# Patient Record
Sex: Female | Born: 1935 | Hispanic: No
Health system: Midwestern US, Community
[De-identification: ages and names within clinical notes are randomized; demographics above are authoritative.]

## PROBLEM LIST (undated history)

## (undated) DIAGNOSIS — E039 Hypothyroidism, unspecified: Secondary | ICD-10-CM

## (undated) DIAGNOSIS — E7800 Pure hypercholesterolemia, unspecified: Secondary | ICD-10-CM

## (undated) DIAGNOSIS — I10 Essential (primary) hypertension: Secondary | ICD-10-CM

## (undated) DIAGNOSIS — M109 Gout, unspecified: Secondary | ICD-10-CM

## (undated) DIAGNOSIS — J189 Pneumonia, unspecified organism: Secondary | ICD-10-CM

## (undated) HISTORY — PX: CATARACT EXTRACTION W/ INTRAOCULAR LENS  IMPLANT, BILATERAL: SHX1307

## (undated) HISTORY — DX: Essential (primary) hypertension: I10

## (undated) HISTORY — PX: FEMUR FRACTURE SURGERY: SHX633

## (undated) HISTORY — DX: Gout, unspecified: M10.9

## (undated) HISTORY — PX: FRACTURE SURGERY: SHX138

## (undated) HISTORY — PX: TUBAL LIGATION: SHX77

## (undated) HISTORY — DX: Pure hypercholesterolemia, unspecified: E78.00

---

## 2001-03-01 ENCOUNTER — Encounter: Payer: Self-pay | Admitting: Infectious Diseases

## 2001-03-01 ENCOUNTER — Ambulatory Visit (HOSPITAL_COMMUNITY): Admission: RE | Admit: 2001-03-01 | Discharge: 2001-03-01 | Payer: Self-pay | Admitting: Infectious Diseases

## 2001-07-19 ENCOUNTER — Encounter: Payer: Self-pay | Admitting: Family Medicine

## 2001-07-19 ENCOUNTER — Ambulatory Visit (HOSPITAL_COMMUNITY): Admission: RE | Admit: 2001-07-19 | Discharge: 2001-07-19 | Payer: Self-pay | Admitting: Family Medicine

## 2004-07-21 ENCOUNTER — Ambulatory Visit (HOSPITAL_COMMUNITY): Admission: RE | Admit: 2004-07-21 | Discharge: 2004-07-21 | Payer: Self-pay | Admitting: Ophthalmology

## 2004-07-26 ENCOUNTER — Emergency Department (HOSPITAL_COMMUNITY): Admission: EM | Admit: 2004-07-26 | Discharge: 2004-07-26 | Payer: Self-pay | Admitting: Emergency Medicine

## 2004-10-21 ENCOUNTER — Ambulatory Visit (HOSPITAL_COMMUNITY): Admission: RE | Admit: 2004-10-21 | Discharge: 2004-10-21 | Payer: Self-pay | Admitting: Family Medicine

## 2006-07-06 ENCOUNTER — Ambulatory Visit (HOSPITAL_COMMUNITY): Admission: RE | Admit: 2006-07-06 | Discharge: 2006-07-06 | Payer: Self-pay

## 2006-08-13 ENCOUNTER — Other Ambulatory Visit: Admission: RE | Admit: 2006-08-13 | Discharge: 2006-08-13 | Payer: Self-pay | Admitting: Internal Medicine

## 2006-08-21 ENCOUNTER — Encounter: Admission: RE | Admit: 2006-08-21 | Discharge: 2006-08-21 | Payer: Self-pay | Admitting: Internal Medicine

## 2007-03-25 ENCOUNTER — Encounter: Admission: RE | Admit: 2007-03-25 | Discharge: 2007-03-25 | Payer: Self-pay | Admitting: Internal Medicine

## 2009-07-05 ENCOUNTER — Other Ambulatory Visit: Admission: RE | Admit: 2009-07-05 | Discharge: 2009-07-05 | Payer: Self-pay | Admitting: Internal Medicine

## 2009-08-30 ENCOUNTER — Encounter: Admission: RE | Admit: 2009-08-30 | Discharge: 2009-08-30 | Payer: Self-pay | Admitting: Internal Medicine

## 2009-12-21 ENCOUNTER — Emergency Department (HOSPITAL_COMMUNITY): Admission: EM | Admit: 2009-12-21 | Discharge: 2009-12-21 | Payer: Self-pay | Admitting: Emergency Medicine

## 2010-11-03 ENCOUNTER — Encounter: Payer: Self-pay | Admitting: Endocrinology

## 2010-11-03 ENCOUNTER — Encounter: Payer: Self-pay | Admitting: Internal Medicine

## 2010-11-05 ENCOUNTER — Emergency Department (HOSPITAL_COMMUNITY)
Admission: EM | Admit: 2010-11-05 | Discharge: 2010-11-05 | Payer: Self-pay | Source: Home / Self Care | Admitting: Emergency Medicine

## 2011-02-28 NOTE — Consult Note (Signed)
[name], [name]                  ACCOUNT NO.:  [id number]   MEDICAL RECORD NO.:  [id number]          PATIENT TYPE:  EMS   LOCATION:                               FACILITY:  [facility]   PHYSICIAN:  Pramod P. Pearlean Brownie, MD    DATE OF BIRTH:  [birth year]   DATE OF CONSULTATION:  07/26/2004  DATE OF DISCHARGE:                                   CONSULTATION   REASON FOR REFERRAL:  Some dizziness and numbness.   HISTORY OF PRESENT ILLNESS:  The patient is a 75 year old lady, who  developed the sudden onset of nausea, dizziness, and some left hand numbness  this afternoon.  The patient states she was in her garage doing some  cleaning and lifting boxes when she noticed a severe pain in her back.  She  took a tablet of indomethacin 50 mg and following that about half an hour  later she noticed significant nausea, retching, and dizziness.  She called  Dr. Otelia Santee office, her primary care physician, and was seen in  the office and was advised to come here for evaluation for a possible TIA.  The patient still complains of some heaviness in her posterior neck and the  head, but she has had this feeling off and on for awhile.  She does have  some history of mild dizziness, particularly when she gets up apparently  suddenly from the bed, which is longstanding.  She complained of some  numbness involving her little and index fingers, which seems to have  improved and gone now over the last several hours.  She denied any numbness  involving the whole left upper extremity, face, or leg.  She denied true  vertigo today, but has had it in the past, particularly when she moves  suddenly, but it has been transient.  She denies any known history of  stroke, TIA, or significant neurological problems.  She has had some back  pain and sciatica in her left leg, which is not currently active.   PAST MEDICAL HISTORY:  1.  Hypertension.  2.  Gout.  3.  Hypothyroidism.  4.  Glaucoma.   HOME MEDICATIONS:   Naprosyn, Synthroid, Atenolol, Motrin, Norvasc.   MEDICATION ALLERGIES:  None known.   SOCIAL HISTORY:  The patient is retired.  She lives alone in [place].  She does not smoke or drink.   REVIEW OF SYSTEMS:  Not significant for recent fever, loss of weight, cough,  chest pain, or diarrhea.  She has some intermittent dizziness and headaches  off and on.   PHYSICAL EXAMINATION:  GENERAL:  A middle-aged lady of Asian-Indian origin,  who looks comfortable.  VITAL SIGNS:  Pulse is 64 per minute, regular respirations per minute, blood  pressure 133/64.  HEENT:  Distal pulses are well-preserved and nontraumatic.  NECK:  Supple without bruit.  ENT:  Exam is unremarkable.  CARDIAC:  No murmurs or gallops.  LUNGS:  Clear to auscultation.  NEUROLOGICAL:  She is awake, alerted, oriented x3, with normal speech and  language function.  There is no aphasia, apraxia, or  dysarthria.  Pupils are  equal and reactive to light and accommodation.  Visual acuity fields are  equal.  Face is symmetrical.  Palate elevates normally.  Tongue is midline.  Motor system exam revealed __________ , symmetric strength, tone, reflexes,  coordination, and sensation in all 4 extremities.  Examination of the left  hand reveals no sensory loss or possible weakness.  She is able to get up  and walk with a slow but steady gait.  She is slightly unsteady with the  eyes closed on a lateral base and while walking tandem.   DATA REVIEWED:  A non-contrast CAT scan of the head done today reveals no  acute abnormality.  MRI scan of the brain done on July 21, 2004, is also  within normal limits.  There is enlargement of the left lacrimal gland noted  incidentally.   LABORATORY DATA:  WBC count and basic metabolic panel are both normal.  Liver enzymes are within normal limits.   IMPRESSION:  A 75 year old lady with symptoms of transient left hand  tingling in the setting of severe nausea following the addition of   Indomethacin, perhaps some adverse gastrointestinal reaction to the  medication.  She has some longstanding dizziness, which may be of critical  __________ etiology.  I do not think are present symptoms are suggestive of  vertebrobasilar ischemia or transient ischemic attack.  I do not believe  further neurovascular evaluation is indicated at the present time.  She may  be discharged home for further followup with the family physician.  If she  has symptoms of recurrent nausea, __________ dysfunction, she may elect to  be followed up by you as an outpatient.  I have advised her not to take  Indomethacin in the future and to use other nonsteroidal anti-inflammatory  drugs.        ___________________________________________  Sunny Schlein. Pearlean Brownie, MD    PPS/MEDQ  D:  07/26/2004  T:  07/26/2004  Job:  [id number]   cc:   Meredith Staggers, M.D.  [address]  [place]  [place] [address]  Fax: [phone]

## 2011-10-28 DIAGNOSIS — I10 Essential (primary) hypertension: Secondary | ICD-10-CM | POA: Diagnosis not present

## 2011-12-09 DIAGNOSIS — E039 Hypothyroidism, unspecified: Secondary | ICD-10-CM | POA: Diagnosis not present

## 2011-12-09 DIAGNOSIS — M779 Enthesopathy, unspecified: Secondary | ICD-10-CM | POA: Diagnosis not present

## 2012-01-20 DIAGNOSIS — M1A00X0 Idiopathic chronic gout, unspecified site, without tophus (tophi): Secondary | ICD-10-CM | POA: Diagnosis not present

## 2012-01-28 DIAGNOSIS — E039 Hypothyroidism, unspecified: Secondary | ICD-10-CM | POA: Diagnosis not present

## 2012-02-06 DIAGNOSIS — H4011X0 Primary open-angle glaucoma, stage unspecified: Secondary | ICD-10-CM | POA: Diagnosis not present

## 2012-02-06 DIAGNOSIS — H26499 Other secondary cataract, unspecified eye: Secondary | ICD-10-CM | POA: Diagnosis not present

## 2012-02-10 DIAGNOSIS — R238 Other skin changes: Secondary | ICD-10-CM | POA: Diagnosis not present

## 2012-02-10 DIAGNOSIS — M109 Gout, unspecified: Secondary | ICD-10-CM | POA: Diagnosis not present

## 2012-02-10 DIAGNOSIS — L259 Unspecified contact dermatitis, unspecified cause: Secondary | ICD-10-CM | POA: Diagnosis not present

## 2012-02-10 DIAGNOSIS — M6530 Trigger finger, unspecified finger: Secondary | ICD-10-CM | POA: Diagnosis not present

## 2012-04-06 DIAGNOSIS — L0100 Impetigo, unspecified: Secondary | ICD-10-CM | POA: Diagnosis not present

## 2012-04-06 DIAGNOSIS — M6530 Trigger finger, unspecified finger: Secondary | ICD-10-CM | POA: Diagnosis not present

## 2012-04-06 DIAGNOSIS — L259 Unspecified contact dermatitis, unspecified cause: Secondary | ICD-10-CM | POA: Diagnosis not present

## 2012-04-12 DIAGNOSIS — M1A00X0 Idiopathic chronic gout, unspecified site, without tophus (tophi): Secondary | ICD-10-CM | POA: Diagnosis not present

## 2012-04-12 DIAGNOSIS — M6530 Trigger finger, unspecified finger: Secondary | ICD-10-CM | POA: Diagnosis not present

## 2012-04-27 DIAGNOSIS — B354 Tinea corporis: Secondary | ICD-10-CM | POA: Diagnosis not present

## 2012-05-10 DIAGNOSIS — B354 Tinea corporis: Secondary | ICD-10-CM | POA: Diagnosis not present

## 2012-05-10 DIAGNOSIS — N390 Urinary tract infection, site not specified: Secondary | ICD-10-CM | POA: Diagnosis not present

## 2012-06-18 DIAGNOSIS — L259 Unspecified contact dermatitis, unspecified cause: Secondary | ICD-10-CM | POA: Diagnosis not present

## 2012-07-13 DIAGNOSIS — R631 Polydipsia: Secondary | ICD-10-CM | POA: Diagnosis not present

## 2012-07-13 DIAGNOSIS — R358 Other polyuria: Secondary | ICD-10-CM | POA: Diagnosis not present

## 2012-07-13 DIAGNOSIS — R3589 Other polyuria: Secondary | ICD-10-CM | POA: Diagnosis not present

## 2012-07-13 DIAGNOSIS — R51 Headache: Secondary | ICD-10-CM | POA: Diagnosis not present

## 2012-07-14 ENCOUNTER — Telehealth (INDEPENDENT_AMBULATORY_CARE_PROVIDER_SITE_OTHER): Payer: Self-pay | Admitting: General Surgery

## 2012-07-14 DIAGNOSIS — R51 Headache: Secondary | ICD-10-CM | POA: Diagnosis not present

## 2012-07-14 DIAGNOSIS — R7982 Elevated C-reactive protein (CRP): Secondary | ICD-10-CM | POA: Diagnosis not present

## 2012-07-14 DIAGNOSIS — R700 Elevated erythrocyte sedimentation rate: Secondary | ICD-10-CM | POA: Diagnosis not present

## 2012-07-14 NOTE — Telephone Encounter (Signed)
ERROR, NO MESSAGE/GY

## 2012-07-15 ENCOUNTER — Encounter (INDEPENDENT_AMBULATORY_CARE_PROVIDER_SITE_OTHER): Payer: Self-pay | Admitting: General Surgery

## 2012-07-15 DIAGNOSIS — M1A00X0 Idiopathic chronic gout, unspecified site, without tophus (tophi): Secondary | ICD-10-CM | POA: Diagnosis not present

## 2012-07-15 DIAGNOSIS — R700 Elevated erythrocyte sedimentation rate: Secondary | ICD-10-CM | POA: Diagnosis not present

## 2012-07-15 DIAGNOSIS — M6530 Trigger finger, unspecified finger: Secondary | ICD-10-CM | POA: Diagnosis not present

## 2012-07-15 DIAGNOSIS — R51 Headache: Secondary | ICD-10-CM | POA: Diagnosis not present

## 2012-07-22 DIAGNOSIS — G44209 Tension-type headache, unspecified, not intractable: Secondary | ICD-10-CM | POA: Diagnosis not present

## 2012-07-29 DIAGNOSIS — R51 Headache: Secondary | ICD-10-CM | POA: Diagnosis not present

## 2012-08-04 DIAGNOSIS — R51 Headache: Secondary | ICD-10-CM | POA: Diagnosis not present

## 2012-08-04 DIAGNOSIS — H4011X0 Primary open-angle glaucoma, stage unspecified: Secondary | ICD-10-CM | POA: Diagnosis not present

## 2012-08-04 DIAGNOSIS — H209 Unspecified iridocyclitis: Secondary | ICD-10-CM | POA: Diagnosis not present

## 2012-08-09 DIAGNOSIS — R700 Elevated erythrocyte sedimentation rate: Secondary | ICD-10-CM | POA: Diagnosis not present

## 2012-08-09 DIAGNOSIS — R51 Headache: Secondary | ICD-10-CM | POA: Diagnosis not present

## 2012-08-09 DIAGNOSIS — M6530 Trigger finger, unspecified finger: Secondary | ICD-10-CM | POA: Diagnosis not present

## 2012-08-09 DIAGNOSIS — M1A00X0 Idiopathic chronic gout, unspecified site, without tophus (tophi): Secondary | ICD-10-CM | POA: Diagnosis not present

## 2012-09-15 DIAGNOSIS — M719 Bursopathy, unspecified: Secondary | ICD-10-CM | POA: Diagnosis not present

## 2012-09-15 DIAGNOSIS — M67919 Unspecified disorder of synovium and tendon, unspecified shoulder: Secondary | ICD-10-CM | POA: Diagnosis not present

## 2012-11-05 DIAGNOSIS — M79609 Pain in unspecified limb: Secondary | ICD-10-CM | POA: Diagnosis not present

## 2012-11-05 DIAGNOSIS — R109 Unspecified abdominal pain: Secondary | ICD-10-CM | POA: Diagnosis not present

## 2012-11-05 DIAGNOSIS — R351 Nocturia: Secondary | ICD-10-CM | POA: Diagnosis not present

## 2012-12-06 DIAGNOSIS — R700 Elevated erythrocyte sedimentation rate: Secondary | ICD-10-CM | POA: Diagnosis not present

## 2012-12-06 DIAGNOSIS — M6530 Trigger finger, unspecified finger: Secondary | ICD-10-CM | POA: Diagnosis not present

## 2012-12-06 DIAGNOSIS — M1A00X0 Idiopathic chronic gout, unspecified site, without tophus (tophi): Secondary | ICD-10-CM | POA: Diagnosis not present

## 2012-12-06 DIAGNOSIS — R51 Headache: Secondary | ICD-10-CM | POA: Diagnosis not present

## 2012-12-31 DIAGNOSIS — Z72820 Sleep deprivation: Secondary | ICD-10-CM | POA: Diagnosis not present

## 2012-12-31 DIAGNOSIS — G44209 Tension-type headache, unspecified, not intractable: Secondary | ICD-10-CM | POA: Diagnosis not present

## 2012-12-31 DIAGNOSIS — M2669 Other specified disorders of temporomandibular joint: Secondary | ICD-10-CM | POA: Diagnosis not present

## 2012-12-31 DIAGNOSIS — I10 Essential (primary) hypertension: Secondary | ICD-10-CM | POA: Diagnosis not present

## 2013-03-09 DIAGNOSIS — M76899 Other specified enthesopathies of unspecified lower limb, excluding foot: Secondary | ICD-10-CM | POA: Diagnosis not present

## 2013-03-09 DIAGNOSIS — R609 Edema, unspecified: Secondary | ICD-10-CM | POA: Diagnosis not present

## 2013-03-09 DIAGNOSIS — M5430 Sciatica, unspecified side: Secondary | ICD-10-CM | POA: Diagnosis not present

## 2013-04-05 DIAGNOSIS — M25519 Pain in unspecified shoulder: Secondary | ICD-10-CM | POA: Diagnosis not present

## 2013-04-05 DIAGNOSIS — R700 Elevated erythrocyte sedimentation rate: Secondary | ICD-10-CM | POA: Diagnosis not present

## 2013-04-05 DIAGNOSIS — M1610 Unilateral primary osteoarthritis, unspecified hip: Secondary | ICD-10-CM | POA: Diagnosis not present

## 2013-04-05 DIAGNOSIS — M1A00X0 Idiopathic chronic gout, unspecified site, without tophus (tophi): Secondary | ICD-10-CM | POA: Diagnosis not present

## 2013-04-05 DIAGNOSIS — M169 Osteoarthritis of hip, unspecified: Secondary | ICD-10-CM | POA: Diagnosis not present

## 2013-04-05 DIAGNOSIS — M25559 Pain in unspecified hip: Secondary | ICD-10-CM | POA: Diagnosis not present

## 2013-04-05 DIAGNOSIS — M6530 Trigger finger, unspecified finger: Secondary | ICD-10-CM | POA: Diagnosis not present

## 2013-04-05 DIAGNOSIS — R51 Headache: Secondary | ICD-10-CM | POA: Diagnosis not present

## 2013-04-05 DIAGNOSIS — M47817 Spondylosis without myelopathy or radiculopathy, lumbosacral region: Secondary | ICD-10-CM | POA: Diagnosis not present

## 2013-04-26 DIAGNOSIS — M5430 Sciatica, unspecified side: Secondary | ICD-10-CM | POA: Diagnosis not present

## 2013-04-26 DIAGNOSIS — Z006 Encounter for examination for normal comparison and control in clinical research program: Secondary | ICD-10-CM | POA: Diagnosis not present

## 2013-04-26 DIAGNOSIS — J029 Acute pharyngitis, unspecified: Secondary | ICD-10-CM | POA: Diagnosis not present

## 2013-04-26 DIAGNOSIS — M76899 Other specified enthesopathies of unspecified lower limb, excluding foot: Secondary | ICD-10-CM | POA: Diagnosis not present

## 2013-04-26 DIAGNOSIS — R609 Edema, unspecified: Secondary | ICD-10-CM | POA: Diagnosis not present

## 2013-04-26 DIAGNOSIS — R079 Chest pain, unspecified: Secondary | ICD-10-CM | POA: Diagnosis not present

## 2013-05-12 DIAGNOSIS — R109 Unspecified abdominal pain: Secondary | ICD-10-CM | POA: Diagnosis not present

## 2013-05-12 DIAGNOSIS — K148 Other diseases of tongue: Secondary | ICD-10-CM | POA: Diagnosis not present

## 2013-05-18 DIAGNOSIS — R609 Edema, unspecified: Secondary | ICD-10-CM | POA: Diagnosis not present

## 2013-07-22 DIAGNOSIS — E039 Hypothyroidism, unspecified: Secondary | ICD-10-CM | POA: Diagnosis not present

## 2013-07-22 DIAGNOSIS — Z23 Encounter for immunization: Secondary | ICD-10-CM | POA: Diagnosis not present

## 2013-07-22 DIAGNOSIS — Z0000 Encounter for general adult medical examination without abnormal findings: Secondary | ICD-10-CM | POA: Diagnosis not present

## 2013-07-22 DIAGNOSIS — M25559 Pain in unspecified hip: Secondary | ICD-10-CM | POA: Diagnosis not present

## 2013-07-22 DIAGNOSIS — M25519 Pain in unspecified shoulder: Secondary | ICD-10-CM | POA: Diagnosis not present

## 2013-07-22 DIAGNOSIS — E785 Hyperlipidemia, unspecified: Secondary | ICD-10-CM | POA: Diagnosis not present

## 2013-07-22 DIAGNOSIS — I10 Essential (primary) hypertension: Secondary | ICD-10-CM | POA: Diagnosis not present

## 2013-08-04 DIAGNOSIS — M25559 Pain in unspecified hip: Secondary | ICD-10-CM | POA: Diagnosis not present

## 2013-08-08 DIAGNOSIS — M25559 Pain in unspecified hip: Secondary | ICD-10-CM | POA: Diagnosis not present

## 2013-08-15 DIAGNOSIS — M25559 Pain in unspecified hip: Secondary | ICD-10-CM | POA: Diagnosis not present

## 2013-08-22 DIAGNOSIS — M25559 Pain in unspecified hip: Secondary | ICD-10-CM | POA: Diagnosis not present

## 2013-08-25 DIAGNOSIS — M25559 Pain in unspecified hip: Secondary | ICD-10-CM | POA: Diagnosis not present

## 2013-09-12 DIAGNOSIS — M109 Gout, unspecified: Secondary | ICD-10-CM | POA: Diagnosis not present

## 2013-09-12 DIAGNOSIS — M169 Osteoarthritis of hip, unspecified: Secondary | ICD-10-CM | POA: Diagnosis not present

## 2013-09-12 DIAGNOSIS — M1610 Unilateral primary osteoarthritis, unspecified hip: Secondary | ICD-10-CM | POA: Diagnosis not present

## 2013-09-12 DIAGNOSIS — L609 Nail disorder, unspecified: Secondary | ICD-10-CM | POA: Diagnosis not present

## 2013-09-12 DIAGNOSIS — E039 Hypothyroidism, unspecified: Secondary | ICD-10-CM | POA: Diagnosis not present

## 2013-09-12 DIAGNOSIS — I10 Essential (primary) hypertension: Secondary | ICD-10-CM | POA: Diagnosis not present

## 2013-09-12 DIAGNOSIS — M25519 Pain in unspecified shoulder: Secondary | ICD-10-CM | POA: Diagnosis not present

## 2013-09-12 DIAGNOSIS — H409 Unspecified glaucoma: Secondary | ICD-10-CM | POA: Diagnosis not present

## 2013-09-12 DIAGNOSIS — M25559 Pain in unspecified hip: Secondary | ICD-10-CM | POA: Diagnosis not present

## 2013-09-12 DIAGNOSIS — R234 Changes in skin texture: Secondary | ICD-10-CM | POA: Diagnosis not present

## 2013-09-22 DIAGNOSIS — M76899 Other specified enthesopathies of unspecified lower limb, excluding foot: Secondary | ICD-10-CM | POA: Diagnosis not present

## 2014-01-30 DIAGNOSIS — M79609 Pain in unspecified limb: Secondary | ICD-10-CM | POA: Diagnosis not present

## 2014-01-30 DIAGNOSIS — I10 Essential (primary) hypertension: Secondary | ICD-10-CM | POA: Diagnosis not present

## 2014-01-30 DIAGNOSIS — M25519 Pain in unspecified shoulder: Secondary | ICD-10-CM | POA: Diagnosis not present

## 2014-01-30 DIAGNOSIS — H9190 Unspecified hearing loss, unspecified ear: Secondary | ICD-10-CM | POA: Diagnosis not present

## 2014-01-31 DIAGNOSIS — I10 Essential (primary) hypertension: Secondary | ICD-10-CM | POA: Diagnosis not present

## 2014-02-01 DIAGNOSIS — M545 Low back pain, unspecified: Secondary | ICD-10-CM | POA: Diagnosis not present

## 2014-02-01 DIAGNOSIS — R269 Unspecified abnormalities of gait and mobility: Secondary | ICD-10-CM | POA: Diagnosis not present

## 2014-02-02 DIAGNOSIS — H903 Sensorineural hearing loss, bilateral: Secondary | ICD-10-CM | POA: Diagnosis not present

## 2014-02-03 DIAGNOSIS — R269 Unspecified abnormalities of gait and mobility: Secondary | ICD-10-CM | POA: Diagnosis not present

## 2014-02-03 DIAGNOSIS — M545 Low back pain, unspecified: Secondary | ICD-10-CM | POA: Diagnosis not present

## 2014-02-07 DIAGNOSIS — R269 Unspecified abnormalities of gait and mobility: Secondary | ICD-10-CM | POA: Diagnosis not present

## 2014-02-07 DIAGNOSIS — M545 Low back pain, unspecified: Secondary | ICD-10-CM | POA: Diagnosis not present

## 2014-02-10 DIAGNOSIS — M545 Low back pain, unspecified: Secondary | ICD-10-CM | POA: Diagnosis not present

## 2014-02-10 DIAGNOSIS — R269 Unspecified abnormalities of gait and mobility: Secondary | ICD-10-CM | POA: Diagnosis not present

## 2014-02-14 DIAGNOSIS — M545 Low back pain, unspecified: Secondary | ICD-10-CM | POA: Diagnosis not present

## 2014-02-14 DIAGNOSIS — R269 Unspecified abnormalities of gait and mobility: Secondary | ICD-10-CM | POA: Diagnosis not present

## 2014-02-16 DIAGNOSIS — S92309A Fracture of unspecified metatarsal bone(s), unspecified foot, initial encounter for closed fracture: Secondary | ICD-10-CM | POA: Diagnosis not present

## 2014-02-16 DIAGNOSIS — S93409A Sprain of unspecified ligament of unspecified ankle, initial encounter: Secondary | ICD-10-CM | POA: Diagnosis not present

## 2014-02-16 DIAGNOSIS — R079 Chest pain, unspecified: Secondary | ICD-10-CM | POA: Diagnosis not present

## 2014-02-16 DIAGNOSIS — S298XXA Other specified injuries of thorax, initial encounter: Secondary | ICD-10-CM | POA: Diagnosis not present

## 2014-02-16 DIAGNOSIS — M79609 Pain in unspecified limb: Secondary | ICD-10-CM | POA: Diagnosis not present

## 2014-02-16 DIAGNOSIS — L608 Other nail disorders: Secondary | ICD-10-CM | POA: Diagnosis not present

## 2014-02-21 ENCOUNTER — Other Ambulatory Visit: Payer: Self-pay | Admitting: Internal Medicine

## 2014-02-21 DIAGNOSIS — R911 Solitary pulmonary nodule: Secondary | ICD-10-CM

## 2014-02-23 ENCOUNTER — Inpatient Hospital Stay: Admission: RE | Admit: 2014-02-23 | Payer: Self-pay | Source: Ambulatory Visit

## 2014-03-01 ENCOUNTER — Other Ambulatory Visit: Payer: Self-pay

## 2014-03-02 ENCOUNTER — Ambulatory Visit
Admission: RE | Admit: 2014-03-02 | Discharge: 2014-03-02 | Disposition: A | Discharge status: Home / Self Care | Payer: Medicare Other | Source: Ambulatory Visit | Attending: Internal Medicine | Admitting: Internal Medicine

## 2014-03-02 DIAGNOSIS — R079 Chest pain, unspecified: Secondary | ICD-10-CM | POA: Diagnosis not present

## 2014-03-02 DIAGNOSIS — S298XXA Other specified injuries of thorax, initial encounter: Secondary | ICD-10-CM | POA: Diagnosis not present

## 2014-03-02 DIAGNOSIS — R911 Solitary pulmonary nodule: Secondary | ICD-10-CM

## 2014-03-02 MED ORDER — IOHEXOL 300 MG/ML  SOLN
75.0000 mL | Freq: Once | INTRAMUSCULAR | Status: AC | PRN
  Administered 2014-03-02: 75 mL via INTRAVENOUS

## 2014-03-07 ENCOUNTER — Ambulatory Visit (INDEPENDENT_AMBULATORY_CARE_PROVIDER_SITE_OTHER): Payer: Medicare Other | Admitting: Internal Medicine

## 2014-03-07 ENCOUNTER — Telehealth: Payer: Self-pay | Admitting: Internal Medicine

## 2014-03-07 ENCOUNTER — Institutional Professional Consult (permissible substitution): Payer: Medicare Other | Admitting: Internal Medicine

## 2014-03-07 ENCOUNTER — Encounter: Payer: Self-pay | Admitting: Internal Medicine

## 2014-03-07 VITALS — BP 122/68 | HR 57 | Ht 62.5 in | Wt 150.4 lb

## 2014-03-07 DIAGNOSIS — R911 Solitary pulmonary nodule: Secondary | ICD-10-CM | POA: Diagnosis not present

## 2014-03-07 DIAGNOSIS — F329 Major depressive disorder, single episode, unspecified: Secondary | ICD-10-CM | POA: Diagnosis not present

## 2014-03-07 NOTE — Patient Instructions (Signed)
LEft lower lobe 1cm nodule seen May 2015  - do PET scan; will call with results to decide next step

## 2014-03-07 NOTE — Telephone Encounter (Signed)
Spoke to pt she is aware that i precerted her ins she has medicare/bcbs no precert required [name]

## 2014-03-07 NOTE — Progress Notes (Signed)
Subjective:    Patient ID: [name], female    DOB: 1936-08-06, 78 y.o.   MRN: [id number]  PCP Horatio Pel, MD  HPI  IOV 03/07/2014   78 year old [place] immigrant female. Lives in [place] since 1960s. Widowed x > 20 years. Husband was thoracic surgeon   Well at baseline. Some 2-3 weeks ago tripped and hurt left chest. Been attending PT and then advised to see MD. PCP did cXR 02/16/14 which apparently showed nodule. REsulted in CT chest 03/02/14 that shows 1.1cm LLL nodule. She denies  Fever, chills, wheeze, cough, hemoptysis, dyspnea, orthopnea, pnd, edema    IMPRESSION: CT chest 03/02/14 6 x 11 mm left lower lobe nodule, corresponding to the radiographic  abnormality, poorly visualized due to respiratory motion.  Consider follow-up CT chest or PET-CT in 3 months, thoracic surgery  consultation, or possibly percutaneous biopsy.  This recommendation follows the consensus statement: Guidelines for  Management of Small Pulmonary Nodules Detected on CT Scans: A  Statement from the [place] as published in Radiology  2005; 237:395-400.  Electronically Signed  By: Julian Hy M.D.  On: 03/02/2014 08:40    Nodule related hx   -  reports that she has never smoked. She has never used smokeless tobacco.  - passive smoker; when married for > 20 years through husband   Past Medical History  Diagnosis Date  . Hypertension   . Gout   . High cholesterol      No family history on file.   History   Social History  . Marital Status: Widowed    Spouse Name: N/A    Number of Children: N/A  . Years of Education: N/A   Occupational History  . retired    Social History Main Topics  . Smoking status: Never Smoker   . Smokeless tobacco: Never Used  . Alcohol Use: No  . Drug Use: No  . Sexual Activity: Not on file   Other Topics Concern  . Not on file   Social History Narrative  . No narrative on file     Allergies  Allergen Reactions  . Asa  [Aspirin]     GI upset and headache  . Sulfa Antibiotics      No outpatient prescriptions prior to visit.   No facility-administered medications prior to visit.      Review of Systems  Constitutional: Positive for fatigue. Negative for fever and unexpected weight change.  HENT: Positive for dental problem. Negative for congestion, ear pain, nosebleeds, postnasal drip, rhinorrhea, sinus pressure, sneezing, sore throat and trouble swallowing.   Eyes: Negative for redness and itching.  Respiratory: Negative for cough, chest tightness, shortness of breath and wheezing.   Cardiovascular: Negative for palpitations and leg swelling.  Gastrointestinal: Negative for nausea and vomiting.  Genitourinary: Negative for dysuria.  Musculoskeletal: Negative for joint swelling.  Skin: Negative for rash.  Neurological: Negative for headaches.  Hematological: Does not bruise/bleed easily.  Psychiatric/Behavioral: Negative for dysphoric mood. The patient is not nervous/anxious.    Current outpatient prescriptions:allopurinol (ZYLOPRIM) 100 MG tablet, Take 2 tablets by mouth daily. , Disp: , Rfl: ;  amLODipine (NORVASC) 5 MG tablet, Take 1 tablet by mouth daily., Disp: , Rfl: ;  labetalol (NORMODYNE) 200 MG tablet, Take 0.5 tablets by mouth 2 (two) times daily., Disp: , Rfl: ;  levobunolol (BETAGAN) 0.5 % ophthalmic solution, Place 1 drop into both eyes daily., Disp: , Rfl:  levothyroxine (SYNTHROID, LEVOTHROID) 88 MCG tablet, Take 1  tablet by mouth daily., Disp: , Rfl:      Objective:   Physical Exam  Vitals reviewed. Constitutional: She is oriented to person, place, and time. She appears well-developed and well-nourished. No distress.  HENT:  Head: Normocephalic and atraumatic.  Right Ear: External ear normal.  Left Ear: External ear normal.  Mouth/Throat: Oropharynx is clear and moist. No oropharyngeal exudate.  Eyes: Conjunctivae and EOM are normal. Pupils are equal, round, and reactive to  light. Right eye exhibits no discharge. Left eye exhibits no discharge. No scleral icterus.  Neck: Normal range of motion. Neck supple. No JVD present. No tracheal deviation present. No thyromegaly present.  Cardiovascular: Normal rate, regular rhythm, normal heart sounds and intact distal pulses.  Exam reveals no gallop and no friction rub.   No murmur heard. Pulmonary/Chest: Effort normal and breath sounds normal. No respiratory distress. She has no wheezes. She has no rales. She exhibits no tenderness.  Myofascial spasm with left infra-axillary, infra-scapulare tenderness and knot  Abdominal: Soft. Bowel sounds are normal. She exhibits no distension and no mass. There is no tenderness. There is no rebound and no guarding.  Musculoskeletal: Normal range of motion. She exhibits no edema and no tenderness.  Lymphadenopathy:    She has no cervical adenopathy.  Neurological: She is alert and oriented to person, place, and time. She has normal reflexes. No cranial nerve deficit. She exhibits normal muscle tone. Coordination normal.  Skin: Skin is warm and dry. No rash noted. She is not diaphoretic. No erythema. No pallor.  Psychiatric: She has a normal mood and affect. Her behavior is normal. Judgment and thought content normal.    Filed Vitals:   03/07/14 1335  Height: 5' 2.5" (1.588 m)  Weight: 150 lb 6.4 oz (68.221 kg)   Filed Vitals:   03/07/14 1335  BP: 122/68  Pulse: 57  Height: 5' 2.5" (1.588 m)  Weight: 150 lb 6.4 oz (68.221 kg)  SpO2: 97%         Assessment & Plan:  LEft lower lobe 1cm nodule seen May 2015  - do PET scan; will call with results to decide next step

## 2014-03-10 DIAGNOSIS — M79609 Pain in unspecified limb: Secondary | ICD-10-CM | POA: Diagnosis not present

## 2014-03-10 DIAGNOSIS — R911 Solitary pulmonary nodule: Secondary | ICD-10-CM | POA: Insufficient documentation

## 2014-03-10 DIAGNOSIS — S93409A Sprain of unspecified ligament of unspecified ankle, initial encounter: Secondary | ICD-10-CM | POA: Diagnosis not present

## 2014-03-10 DIAGNOSIS — S92309A Fracture of unspecified metatarsal bone(s), unspecified foot, initial encounter for closed fracture: Secondary | ICD-10-CM | POA: Diagnosis not present

## 2014-03-10 NOTE — Assessment & Plan Note (Signed)
Non smoker, but some passive smoking when married. Has new incidental finding of 1cm LLL nodule. INdeterminate prob for lung cancer. Will do PET scan and then decide. Based on results, might consider INDIDx blood assay for Negative predictive value and Oncimmune assay for positive predictive value

## 2014-03-15 DIAGNOSIS — H4011X0 Primary open-angle glaucoma, stage unspecified: Secondary | ICD-10-CM | POA: Diagnosis not present

## 2014-03-20 ENCOUNTER — Encounter (HOSPITAL_COMMUNITY)
Admission: RE | Admit: 2014-03-20 | Discharge: 2014-03-20 | Disposition: A | Discharge status: Home / Self Care | Payer: Medicare Other | Source: Ambulatory Visit | Attending: Internal Medicine | Admitting: Internal Medicine

## 2014-03-20 DIAGNOSIS — R911 Solitary pulmonary nodule: Secondary | ICD-10-CM

## 2014-03-20 DIAGNOSIS — C3430 Malignant neoplasm of lower lobe, unspecified bronchus or lung: Secondary | ICD-10-CM | POA: Insufficient documentation

## 2014-03-20 LAB — GLUCOSE, CAPILLARY: GLUCOSE-CAPILLARY: 97 mg/dL (ref 70–99)

## 2014-03-20 MED ORDER — FLUDEOXYGLUCOSE F - 18 (FDG) INJECTION
7.6000 | Freq: Once | INTRAVENOUS | Status: AC | PRN
  Administered 2014-03-20: 7.6 via INTRAVENOUS

## 2014-03-23 ENCOUNTER — Telehealth: Payer: Self-pay | Admitting: Internal Medicine

## 2014-03-23 NOTE — Telephone Encounter (Signed)
Called spoke with pt. She is scheduled to come in and see MR 05/03/14. Nothing further needed

## 2014-03-23 NOTE — Telephone Encounter (Signed)
[name]  Please give [name] appt to discuss findnigs on PET scan but you can tell her that nodule is not c/w cancer but I need to d/w her other non-urgent findings and plan with the nodule. So, make appt to see me or NP; first avail is fine  Thanks  Dr. Brand Males, M.D., [facility].C.P Pulmonary and Critical Care Medicine Staff Physician [facility] Pager: [phone], If no answer or between  15:00h - 7:00h: call [phone]  03/23/2014 4:54 AM    My plan 1. Nodule - consider serial CT and indidx nodule blood test 2. Maxillary sinus opacification -consider ENT referral 3. Co Art calcification - d/w her about cards referral   PET scan 03/20/14  IMPRESSION:  1. Below malignant range FDG uptake is associated with the left  lower lobe pulmonary nodule. The SUV max is equal to 0.68.  2. Atherosclerotic disease.  3. Right maxillary sinus opacification.  Electronically Signed  By: Kerby Moors M.D.  On: 03/20/2014 09:10

## 2014-03-24 DIAGNOSIS — S92309A Fracture of unspecified metatarsal bone(s), unspecified foot, initial encounter for closed fracture: Secondary | ICD-10-CM | POA: Diagnosis not present

## 2014-03-24 DIAGNOSIS — S93409A Sprain of unspecified ligament of unspecified ankle, initial encounter: Secondary | ICD-10-CM | POA: Diagnosis not present

## 2014-03-24 DIAGNOSIS — M79609 Pain in unspecified limb: Secondary | ICD-10-CM | POA: Diagnosis not present

## 2014-04-03 ENCOUNTER — Encounter: Payer: Self-pay | Admitting: Internal Medicine

## 2014-04-03 ENCOUNTER — Ambulatory Visit (INDEPENDENT_AMBULATORY_CARE_PROVIDER_SITE_OTHER): Payer: Medicare Other | Admitting: Internal Medicine

## 2014-04-03 VITALS — BP 120/74 | HR 62 | Ht 62.0 in | Wt 153.2 lb

## 2014-04-03 DIAGNOSIS — I2584 Coronary atherosclerosis due to calcified coronary lesion: Secondary | ICD-10-CM

## 2014-04-03 DIAGNOSIS — R911 Solitary pulmonary nodule: Secondary | ICD-10-CM

## 2014-04-03 DIAGNOSIS — I2510 Atherosclerotic heart disease of native coronary artery without angina pectoris: Secondary | ICD-10-CM

## 2014-04-03 DIAGNOSIS — J320 Chronic maxillary sinusitis: Secondary | ICD-10-CM | POA: Diagnosis not present

## 2014-04-03 NOTE — Assessment & Plan Note (Signed)
#  Coronary ARtery calcfication  - if you have not had cardiac stress test in [place] last few years then you need one; please find out and let me know

## 2014-04-03 NOTE — Assessment & Plan Note (Addendum)
#  LEft lower lobe 1cm nodule seen May 2015 CT chest   - low uptake on PET scan June 2015 lowers the possibility this cancer to low  - repeat CT chest wo contrast mid to end august 2015 [place]   #FOllowup  Mid to ENd August 2015 after CT chest - have it here or iin [place]  (> 50% of this 15 min visit spent in face to face counseling)

## 2014-04-03 NOTE — Progress Notes (Signed)
Subjective:    Patient ID: [name], female    DOB: 05/18/1936, 78 y.o.   MRN: [id number]  HPI   PCP Horatio Pel, MD  HPI  IOV 03/07/2014   78 year old [place] immigrant female. Lives in [place] since 1960s. Widowed x > 20 years. Husband was thoracic surgeon   Well at baseline. Some 2-3 weeks ago tripped and hurt left chest. Been attending PT and then advised to see MD. PCP did cXR 02/16/14 which apparently showed nodule. REsulted in CT chest 03/02/14 that shows 1.1cm LLL nodule. She denies  Fever, chills, wheeze, cough, hemoptysis, dyspnea, orthopnea, pnd, edema    IMPRESSION: CT chest 03/02/14 6 x 11 mm left lower lobe nodule, corresponding to the radiographic  abnormality, poorly visualized due to respiratory motion.  Consider follow-up CT chest or PET-CT in 3 months, thoracic surgery  consultation, or possibly percutaneous biopsy.  This recommendation follows the consensus statement: Guidelines for  Management of Small Pulmonary Nodules Detected on CT Scans: A  Statement from the [place] as published in Radiology  2005; 237:395-400.  Electronically Signed  By: Julian Hy M.D.  On: 03/02/2014 08:40    Nodule related hx   -  reports that she has never smoked. She has never used smokeless tobacco.  - passive smoker; when married for > 20 years through husband     OV 04/03/2014  Chief Complaint  Patient presents with  . Follow-up    Pt states breathing has improved. Pt denies SOB, cough and CP. Pt states the only complaint she has is while recently traveling in car she had BIL pedal edema and had resolved by the next day. With PET scan results.     FU: Lung nodule LLL 1cm - seen after fall in may 2015  Here for fu after PET Scan.  No new issues. Here to discuss PET results which show 3 issues. PET scan 03/20/14  - LLL nodule SUV is 0.68 and below malignant range. Followup surveillance strategy preferred by her but [place] through  end sept 2015  - Right Max Sinus Opacification on PET but asymptomatic  - Coronary Artery Calficaction +  - she thinks she had a stress test < 5 years ago in the [place] area. She will investigate about this   ROS +ve for chronic issues   Review of Systems  Constitutional: Negative for fever and unexpected weight change.  HENT: Negative for congestion, dental problem, ear pain, nosebleeds, postnasal drip, rhinorrhea, sinus pressure, sneezing, sore throat and trouble swallowing.   Eyes: Negative for redness and itching.  Respiratory: Negative for cough, chest tightness, shortness of breath and wheezing.   Cardiovascular: Positive for leg swelling. Negative for palpitations.  Gastrointestinal: Negative for nausea and vomiting.  Genitourinary: Negative for dysuria.  Musculoskeletal: Positive for joint swelling.  Skin: Negative for rash.  Neurological: Negative for headaches.  Hematological: Does not bruise/bleed easily.  Psychiatric/Behavioral: Negative for dysphoric mood. The patient is not nervous/anxious.        Objective:   Physical Exam Filed Vitals:   04/03/14 1530  BP: 120/74  Pulse: 62  Height: 5\' 2"  (1.575 m)  Weight: 153 lb 3.2 oz (69.491 kg)  SpO2: 98%    Discussion only visit.        Assessment & Plan:  #LEft lower lobe 1cm nodule seen May 2015 CT chest   - low uptake on PET scan June 2015 lowers the possibility this cancer to low  -  repeat CT chest wo contrast mid to end august 2015 [place]   #right Maxillary sinus opacification   - please talk to Horatio Pel, MD about this  #Coronary ARtery calcfication  - if you have not had cardiac stress test in [place] last few years then you need one; please find out and let me know  #FOllowup  Mid August 2015 after CT chest

## 2014-04-03 NOTE — Patient Instructions (Addendum)
#  LEft lower lobe 1cm nodule seen May 2015 CT chest   - low uptake on PET scan June 2015 lowers the possibility this cancer to low  - repeat CT chest wo contrast mid to end august 2015 [place]   #right Maxillary sinus opacification   - please talk to Horatio Pel, MD about this  #Coronary ARtery calcfication  - if you have not had cardiac stress test in [place] last few years then you need one; please find out and let me know  #FOllowup  Mid August 2015 after CT chest

## 2014-04-03 NOTE — Assessment & Plan Note (Signed)
#  right Maxillary sinus opacification   - please talk to Horatio Pel, MD about this

## 2014-04-04 ENCOUNTER — Other Ambulatory Visit: Payer: Self-pay | Admitting: Internal Medicine

## 2014-04-04 DIAGNOSIS — R911 Solitary pulmonary nodule: Secondary | ICD-10-CM

## 2014-05-08 DIAGNOSIS — R300 Dysuria: Secondary | ICD-10-CM | POA: Diagnosis not present

## 2014-05-09 DIAGNOSIS — R011 Cardiac murmur, unspecified: Secondary | ICD-10-CM | POA: Diagnosis not present

## 2014-05-09 DIAGNOSIS — R3989 Other symptoms and signs involving the genitourinary system: Secondary | ICD-10-CM | POA: Diagnosis not present

## 2014-05-12 ENCOUNTER — Telehealth: Payer: Self-pay | Admitting: Internal Medicine

## 2014-05-12 NOTE — Telephone Encounter (Signed)
Will forward to MR to make him aware.

## 2014-05-24 ENCOUNTER — Telehealth: Payer: Self-pay | Admitting: Internal Medicine

## 2014-05-24 NOTE — Telephone Encounter (Signed)
Please let he rknow that is fine but she should try to get a CT chest without contrast next 3-6 months. If she is going to [place], tends to be cheap there

## 2014-05-24 NOTE — Telephone Encounter (Signed)
lmtcb x1 

## 2014-05-24 NOTE — Telephone Encounter (Signed)
FYI for MR 

## 2014-05-25 NOTE — Telephone Encounter (Signed)
lmtcb x2 

## 2014-05-26 NOTE — Telephone Encounter (Signed)
lmomtcb x 3  

## 2014-05-29 NOTE — Telephone Encounter (Signed)
Attempted to call pt again x 4.  Will sign off of this message and wait for the pt to call back.

## 2014-06-01 ENCOUNTER — Inpatient Hospital Stay: Admission: RE | Admit: 2014-06-01 | Payer: Medicare Other | Source: Ambulatory Visit

## 2014-06-08 DIAGNOSIS — J019 Acute sinusitis, unspecified: Secondary | ICD-10-CM | POA: Diagnosis not present

## 2014-07-18 ENCOUNTER — Telehealth: Payer: Self-pay | Admitting: Internal Medicine

## 2014-07-18 NOTE — Telephone Encounter (Signed)
I called pt to schedule an appt from a recall-t does not want to sched an appt at this time.  Pt will call if anyting is needed.  Nothing further needed at this time.  [name]

## 2014-07-19 DIAGNOSIS — I10 Essential (primary) hypertension: Secondary | ICD-10-CM | POA: Diagnosis not present

## 2014-07-19 DIAGNOSIS — E039 Hypothyroidism, unspecified: Secondary | ICD-10-CM | POA: Diagnosis not present

## 2014-07-19 DIAGNOSIS — R609 Edema, unspecified: Secondary | ICD-10-CM | POA: Diagnosis not present

## 2014-07-25 DIAGNOSIS — Z0000 Encounter for general adult medical examination without abnormal findings: Secondary | ICD-10-CM | POA: Diagnosis not present

## 2014-07-25 DIAGNOSIS — Z23 Encounter for immunization: Secondary | ICD-10-CM | POA: Diagnosis not present

## 2014-07-28 DIAGNOSIS — F439 Reaction to severe stress, unspecified: Secondary | ICD-10-CM | POA: Diagnosis not present

## 2014-07-28 DIAGNOSIS — R5383 Other fatigue: Secondary | ICD-10-CM | POA: Diagnosis not present

## 2014-07-28 DIAGNOSIS — M2550 Pain in unspecified joint: Secondary | ICD-10-CM | POA: Diagnosis not present

## 2014-07-28 DIAGNOSIS — M7740 Metatarsalgia, unspecified foot: Secondary | ICD-10-CM | POA: Diagnosis not present

## 2014-08-02 DIAGNOSIS — H4011X2 Primary open-angle glaucoma, moderate stage: Secondary | ICD-10-CM | POA: Diagnosis not present

## 2014-08-10 DIAGNOSIS — M150 Primary generalized (osteo)arthritis: Secondary | ICD-10-CM | POA: Diagnosis not present

## 2014-08-10 DIAGNOSIS — M1A09X0 Idiopathic chronic gout, multiple sites, without tophus (tophi): Secondary | ICD-10-CM | POA: Diagnosis not present

## 2014-08-10 DIAGNOSIS — M5136 Other intervertebral disc degeneration, lumbar region: Secondary | ICD-10-CM | POA: Diagnosis not present

## 2014-08-10 DIAGNOSIS — R700 Elevated erythrocyte sedimentation rate: Secondary | ICD-10-CM | POA: Diagnosis not present

## 2014-08-24 DIAGNOSIS — R079 Chest pain, unspecified: Secondary | ICD-10-CM | POA: Diagnosis not present

## 2014-08-24 DIAGNOSIS — I10 Essential (primary) hypertension: Secondary | ICD-10-CM | POA: Diagnosis not present

## 2014-10-23 DIAGNOSIS — L309 Dermatitis, unspecified: Secondary | ICD-10-CM | POA: Diagnosis not present

## 2014-10-23 DIAGNOSIS — L853 Xerosis cutis: Secondary | ICD-10-CM | POA: Diagnosis not present

## 2014-10-23 DIAGNOSIS — I10 Essential (primary) hypertension: Secondary | ICD-10-CM | POA: Diagnosis not present

## 2014-10-30 DIAGNOSIS — F439 Reaction to severe stress, unspecified: Secondary | ICD-10-CM | POA: Diagnosis not present

## 2014-11-08 DIAGNOSIS — Z23 Encounter for immunization: Secondary | ICD-10-CM | POA: Diagnosis not present

## 2015-03-15 DIAGNOSIS — H4011X1 Primary open-angle glaucoma, mild stage: Secondary | ICD-10-CM | POA: Diagnosis not present

## 2015-03-15 DIAGNOSIS — H16223 Keratoconjunctivitis sicca, not specified as Sjogren's, bilateral: Secondary | ICD-10-CM | POA: Diagnosis not present

## 2015-03-27 DIAGNOSIS — M19041 Primary osteoarthritis, right hand: Secondary | ICD-10-CM | POA: Diagnosis not present

## 2015-03-27 DIAGNOSIS — M1811 Unilateral primary osteoarthritis of first carpometacarpal joint, right hand: Secondary | ICD-10-CM | POA: Diagnosis not present

## 2015-09-01 DIAGNOSIS — Z23 Encounter for immunization: Secondary | ICD-10-CM | POA: Diagnosis not present

## 2015-09-03 DIAGNOSIS — E039 Hypothyroidism, unspecified: Secondary | ICD-10-CM | POA: Diagnosis not present

## 2015-09-03 DIAGNOSIS — N390 Urinary tract infection, site not specified: Secondary | ICD-10-CM | POA: Diagnosis not present

## 2015-09-03 DIAGNOSIS — I10 Essential (primary) hypertension: Secondary | ICD-10-CM | POA: Diagnosis not present

## 2015-10-31 DIAGNOSIS — M179 Osteoarthritis of knee, unspecified: Secondary | ICD-10-CM | POA: Diagnosis not present

## 2015-10-31 DIAGNOSIS — M25561 Pain in right knee: Secondary | ICD-10-CM | POA: Diagnosis not present

## 2015-10-31 DIAGNOSIS — M159 Polyosteoarthritis, unspecified: Secondary | ICD-10-CM | POA: Diagnosis not present

## 2015-11-09 DIAGNOSIS — H16223 Keratoconjunctivitis sicca, not specified as Sjogren's, bilateral: Secondary | ICD-10-CM | POA: Diagnosis not present

## 2015-11-09 DIAGNOSIS — H401132 Primary open-angle glaucoma, bilateral, moderate stage: Secondary | ICD-10-CM | POA: Diagnosis not present

## 2015-12-03 DIAGNOSIS — Z0000 Encounter for general adult medical examination without abnormal findings: Secondary | ICD-10-CM | POA: Diagnosis not present

## 2015-12-07 DIAGNOSIS — R7982 Elevated C-reactive protein (CRP): Secondary | ICD-10-CM | POA: Diagnosis not present

## 2015-12-07 DIAGNOSIS — F329 Major depressive disorder, single episode, unspecified: Secondary | ICD-10-CM | POA: Diagnosis not present

## 2015-12-07 DIAGNOSIS — H6122 Impacted cerumen, left ear: Secondary | ICD-10-CM | POA: Diagnosis not present

## 2015-12-07 DIAGNOSIS — R911 Solitary pulmonary nodule: Secondary | ICD-10-CM | POA: Diagnosis not present

## 2015-12-07 DIAGNOSIS — M109 Gout, unspecified: Secondary | ICD-10-CM | POA: Diagnosis not present

## 2015-12-07 DIAGNOSIS — Z1212 Encounter for screening for malignant neoplasm of rectum: Secondary | ICD-10-CM | POA: Diagnosis not present

## 2015-12-13 DIAGNOSIS — R911 Solitary pulmonary nodule: Secondary | ICD-10-CM | POA: Diagnosis not present

## 2015-12-13 DIAGNOSIS — R079 Chest pain, unspecified: Secondary | ICD-10-CM | POA: Diagnosis not present

## 2015-12-13 DIAGNOSIS — M940 Chondrocostal junction syndrome [Tietze]: Secondary | ICD-10-CM | POA: Diagnosis not present

## 2015-12-13 DIAGNOSIS — M353 Polymyalgia rheumatica: Secondary | ICD-10-CM | POA: Diagnosis not present

## 2015-12-24 DIAGNOSIS — L821 Other seborrheic keratosis: Secondary | ICD-10-CM | POA: Diagnosis not present

## 2015-12-24 DIAGNOSIS — B351 Tinea unguium: Secondary | ICD-10-CM | POA: Diagnosis not present

## 2015-12-28 DIAGNOSIS — M79672 Pain in left foot: Secondary | ICD-10-CM | POA: Diagnosis not present

## 2015-12-28 DIAGNOSIS — M25511 Pain in right shoulder: Secondary | ICD-10-CM | POA: Diagnosis not present

## 2016-01-03 DIAGNOSIS — M25511 Pain in right shoulder: Secondary | ICD-10-CM | POA: Diagnosis not present

## 2016-01-03 DIAGNOSIS — M79672 Pain in left foot: Secondary | ICD-10-CM | POA: Diagnosis not present

## 2016-01-14 DIAGNOSIS — M150 Primary generalized (osteo)arthritis: Secondary | ICD-10-CM | POA: Diagnosis not present

## 2016-01-14 DIAGNOSIS — M25561 Pain in right knee: Secondary | ICD-10-CM | POA: Diagnosis not present

## 2016-01-14 DIAGNOSIS — M5136 Other intervertebral disc degeneration, lumbar region: Secondary | ICD-10-CM | POA: Diagnosis not present

## 2016-01-14 DIAGNOSIS — R700 Elevated erythrocyte sedimentation rate: Secondary | ICD-10-CM | POA: Diagnosis not present

## 2016-01-14 DIAGNOSIS — M1A09X0 Idiopathic chronic gout, multiple sites, without tophus (tophi): Secondary | ICD-10-CM | POA: Diagnosis not present

## 2016-02-20 DIAGNOSIS — S025XXB Fracture of tooth (traumatic), initial encounter for open fracture: Secondary | ICD-10-CM | POA: Diagnosis not present

## 2016-02-20 DIAGNOSIS — R51 Headache: Secondary | ICD-10-CM | POA: Diagnosis not present

## 2016-02-21 DIAGNOSIS — H401134 Primary open-angle glaucoma, bilateral, indeterminate stage: Secondary | ICD-10-CM | POA: Diagnosis not present

## 2016-02-21 DIAGNOSIS — H5712 Ocular pain, left eye: Secondary | ICD-10-CM | POA: Diagnosis not present

## 2016-02-22 DIAGNOSIS — M316 Other giant cell arteritis: Secondary | ICD-10-CM | POA: Diagnosis not present

## 2016-02-22 DIAGNOSIS — H5712 Ocular pain, left eye: Secondary | ICD-10-CM | POA: Diagnosis not present

## 2016-02-27 DIAGNOSIS — M315 Giant cell arteritis with polymyalgia rheumatica: Secondary | ICD-10-CM | POA: Diagnosis not present

## 2016-02-27 DIAGNOSIS — H401134 Primary open-angle glaucoma, bilateral, indeterminate stage: Secondary | ICD-10-CM | POA: Diagnosis not present

## 2016-02-27 DIAGNOSIS — H5712 Ocular pain, left eye: Secondary | ICD-10-CM | POA: Diagnosis not present

## 2016-02-28 DIAGNOSIS — M26622 Arthralgia of left temporomandibular joint: Secondary | ICD-10-CM | POA: Diagnosis not present

## 2016-02-28 DIAGNOSIS — H538 Other visual disturbances: Secondary | ICD-10-CM | POA: Diagnosis not present

## 2016-02-28 DIAGNOSIS — H5712 Ocular pain, left eye: Secondary | ICD-10-CM | POA: Diagnosis not present

## 2016-03-06 DIAGNOSIS — T148 Other injury of unspecified body region: Secondary | ICD-10-CM | POA: Diagnosis not present

## 2016-03-06 DIAGNOSIS — M2550 Pain in unspecified joint: Secondary | ICD-10-CM | POA: Diagnosis not present

## 2016-03-11 DIAGNOSIS — H903 Sensorineural hearing loss, bilateral: Secondary | ICD-10-CM | POA: Diagnosis not present

## 2016-03-11 DIAGNOSIS — H9209 Otalgia, unspecified ear: Secondary | ICD-10-CM | POA: Diagnosis not present

## 2016-04-10 ENCOUNTER — Telehealth: Payer: Self-pay | Admitting: Internal Medicine

## 2016-04-10 NOTE — Telephone Encounter (Signed)
Patient somehow got my cell and called me. Informed her of nodule fu need from 2 years ago. Says she followed up at [place] and "Everythign is fine" and she does not want to followup

## 2016-04-25 DIAGNOSIS — L0390 Cellulitis, unspecified: Secondary | ICD-10-CM | POA: Diagnosis not present

## 2016-04-29 DIAGNOSIS — H903 Sensorineural hearing loss, bilateral: Secondary | ICD-10-CM | POA: Diagnosis not present

## 2016-04-29 DIAGNOSIS — H9209 Otalgia, unspecified ear: Secondary | ICD-10-CM | POA: Diagnosis not present

## 2016-05-06 DIAGNOSIS — M216X2 Other acquired deformities of left foot: Secondary | ICD-10-CM | POA: Diagnosis not present

## 2016-05-06 DIAGNOSIS — L603 Nail dystrophy: Secondary | ICD-10-CM | POA: Diagnosis not present

## 2016-05-06 DIAGNOSIS — L602 Onychogryphosis: Secondary | ICD-10-CM | POA: Diagnosis not present

## 2016-05-15 DIAGNOSIS — L603 Nail dystrophy: Secondary | ICD-10-CM | POA: Diagnosis not present

## 2016-07-01 DIAGNOSIS — M150 Primary generalized (osteo)arthritis: Secondary | ICD-10-CM | POA: Diagnosis not present

## 2016-07-01 DIAGNOSIS — M1A09X0 Idiopathic chronic gout, multiple sites, without tophus (tophi): Secondary | ICD-10-CM | POA: Diagnosis not present

## 2016-07-01 DIAGNOSIS — M25561 Pain in right knee: Secondary | ICD-10-CM | POA: Diagnosis not present

## 2016-07-01 DIAGNOSIS — R700 Elevated erythrocyte sedimentation rate: Secondary | ICD-10-CM | POA: Diagnosis not present

## 2016-07-01 DIAGNOSIS — M5136 Other intervertebral disc degeneration, lumbar region: Secondary | ICD-10-CM | POA: Diagnosis not present

## 2016-07-04 DIAGNOSIS — Z23 Encounter for immunization: Secondary | ICD-10-CM | POA: Diagnosis not present

## 2016-07-22 DIAGNOSIS — R3915 Urgency of urination: Secondary | ICD-10-CM | POA: Diagnosis not present

## 2016-07-22 DIAGNOSIS — M549 Dorsalgia, unspecified: Secondary | ICD-10-CM | POA: Diagnosis not present

## 2016-09-12 DIAGNOSIS — T148XXA Other injury of unspecified body region, initial encounter: Secondary | ICD-10-CM | POA: Diagnosis not present

## 2016-09-12 DIAGNOSIS — M2550 Pain in unspecified joint: Secondary | ICD-10-CM | POA: Diagnosis not present

## 2016-09-12 DIAGNOSIS — M545 Low back pain: Secondary | ICD-10-CM | POA: Diagnosis not present

## 2016-10-10 ENCOUNTER — Emergency Department (HOSPITAL_COMMUNITY): Payer: Medicare Other

## 2016-10-10 ENCOUNTER — Encounter (HOSPITAL_COMMUNITY): Payer: Self-pay

## 2016-10-10 ENCOUNTER — Emergency Department (HOSPITAL_COMMUNITY)
Admission: EM | Admit: 2016-10-10 | Discharge: 2016-10-10 | Disposition: A | Discharge status: Home / Self Care | Payer: Medicare Other | Attending: Emergency Medicine | Admitting: Emergency Medicine

## 2016-10-10 DIAGNOSIS — R0602 Shortness of breath: Secondary | ICD-10-CM | POA: Diagnosis not present

## 2016-10-10 DIAGNOSIS — S42291A Other displaced fracture of upper end of right humerus, initial encounter for closed fracture: Secondary | ICD-10-CM | POA: Diagnosis not present

## 2016-10-10 DIAGNOSIS — S20219A Contusion of unspecified front wall of thorax, initial encounter: Secondary | ICD-10-CM | POA: Insufficient documentation

## 2016-10-10 DIAGNOSIS — W010XXA Fall on same level from slipping, tripping and stumbling without subsequent striking against object, initial encounter: Secondary | ICD-10-CM | POA: Diagnosis not present

## 2016-10-10 DIAGNOSIS — Y9301 Activity, walking, marching and hiking: Secondary | ICD-10-CM | POA: Insufficient documentation

## 2016-10-10 DIAGNOSIS — Y929 Unspecified place or not applicable: Secondary | ICD-10-CM | POA: Diagnosis not present

## 2016-10-10 DIAGNOSIS — Y999 Unspecified external cause status: Secondary | ICD-10-CM | POA: Insufficient documentation

## 2016-10-10 DIAGNOSIS — I10 Essential (primary) hypertension: Secondary | ICD-10-CM | POA: Diagnosis not present

## 2016-10-10 DIAGNOSIS — T148XXA Other injury of unspecified body region, initial encounter: Secondary | ICD-10-CM | POA: Diagnosis not present

## 2016-10-10 DIAGNOSIS — M25511 Pain in right shoulder: Secondary | ICD-10-CM | POA: Diagnosis not present

## 2016-10-10 DIAGNOSIS — S20211A Contusion of right front wall of thorax, initial encounter: Secondary | ICD-10-CM | POA: Diagnosis not present

## 2016-10-10 DIAGNOSIS — S4991XA Unspecified injury of right shoulder and upper arm, initial encounter: Secondary | ICD-10-CM | POA: Diagnosis present

## 2016-10-10 DIAGNOSIS — S42201A Unspecified fracture of upper end of right humerus, initial encounter for closed fracture: Secondary | ICD-10-CM | POA: Diagnosis not present

## 2016-10-10 MED ORDER — MORPHINE SULFATE (PF) 4 MG/ML IV SOLN
4.0000 mg | Freq: Once | INTRAVENOUS | Status: AC
  Administered 2016-10-10: 4 mg via INTRAVENOUS
  Filled 2016-10-10: qty 1

## 2016-10-10 MED ORDER — HYDROCODONE-ACETAMINOPHEN 5-325 MG PO TABS: 1.0000 | ORAL_TABLET | Freq: Four times a day (QID) | ORAL | 0 refills | Status: DC | PRN

## 2016-10-10 MED ORDER — METOCLOPRAMIDE HCL 10 MG PO TABS: 10.0000 mg | ORAL_TABLET | Freq: Four times a day (QID) | ORAL | 0 refills | Status: AC | PRN

## 2016-10-10 MED ORDER — METOCLOPRAMIDE HCL 5 MG/ML IJ SOLN
5.0000 mg | Freq: Once | INTRAMUSCULAR | Status: AC
  Administered 2016-10-10: 5 mg via INTRAVENOUS
  Filled 2016-10-10: qty 2

## 2016-10-10 NOTE — ED Notes (Signed)
Sling applied .

## 2016-10-10 NOTE — ED Triage Notes (Signed)
Per [facility] patient from home.  C/O right shoulder pain after tripping on the flower bed in front of her house.  Patient denies LOC, denies hitting head, denies neck or back pain, denies being on blood thinners.   Per [facility] patient stated that "it feels like it is broken".  Patient has 20 g left hand.  Given 150 mcg fentanyl and 8 mg Zofran IV and 500 ml NS en route.

## 2016-10-10 NOTE — Discharge Instructions (Signed)
Take the pain medicine prescribed for bad pain or Tylenol for mild pain as directed. Don't take Tylenol together with the pain medicine prescribed as the combination can be dangerous to your liver. Call Dr. Trevor Mace office on 10/14/2016 to schedule the next available appointment. Tell office staff that you were seen here. Place an ice pack over the painful area 4 times daily for 30 minutes at a time

## 2016-10-10 NOTE — ED Notes (Signed)
Patient transported to X-ray 

## 2016-10-10 NOTE — ED Provider Notes (Signed)
[facility] DEPT Provider Note   CSN: [id number] Arrival date & time: 10/10/16  1506     History   Chief Complaint Chief Complaint  Patient presents with  . Fall    HPI [name] is a 80 y.o. female.Patient tripped and fell outside of her home 2 PM today. She complains of pain at right upper arm and right anterior ribs since the fall. She was feeling well prior to the event. She denies striking her head denies neck pain denies loss of consciousness denies shortness of breath. Pain worse with changing positions improved with remaining still. EMS treated patient with sling of right arm and fentanyl 150 g IV as well as Zofran 8 mg IV and 500 mL saline bolus . nursing reports to me that she was hemodynamically stable in the field.  HPI  Past Medical History:  Diagnosis Date  . Gout   . High cholesterol   . Hypertension     Patient Active Problem List   Diagnosis Date Noted  . Coronary artery calcification 04/03/2014  . Right maxillary sinusitis, chronic 04/03/2014  . Lung nodule, solitary 03/10/2014    Past Surgical History:  Procedure Laterality Date  . FEMUR FRACTURE SURGERY     rod in right thigh  . TUBAL LIGATION      OB History    No data available       Home Medications    Prior to Admission medications   Medication Sig Start Date End Date Taking? Authorizing Provider  allopurinol (ZYLOPRIM) 100 MG tablet Take 2 tablets by mouth daily.     Historical Provider, MD  amLODipine (NORVASC) 5 MG tablet Take 1 tablet by mouth daily.    Historical Provider, MD  labetalol (NORMODYNE) 200 MG tablet Take 0.5 tablets by mouth 2 (two) times daily.    Historical Provider, MD  levobunolol (BETAGAN) 0.5 % ophthalmic solution Place 1 drop into both eyes daily.    Historical Provider, MD  levothyroxine (SYNTHROID, LEVOTHROID) 88 MCG tablet Take 1 tablet by mouth daily.    Historical Provider, MD  naproxen sodium (ANAPROX) 220 MG tablet Take 220 mg by mouth as needed.     Historical Provider, MD    Family History No family history on file.  Social History Social History  Substance Use Topics  . Smoking status: Never Smoker  . Smokeless tobacco: Never Used  . Alcohol use No     Allergies   Asa [aspirin] and Sulfa antibiotics   Review of Systems Review of Systems  Constitutional: Negative.   HENT: Negative.   Respiratory: Negative.   Cardiovascular: Positive for chest pain.       Right rib pain  Gastrointestinal: Negative.   Musculoskeletal: Positive for arthralgias.       Pain at right upper arm and right shoulder  Skin: Negative.   Neurological: Negative.   Psychiatric/Behavioral: Negative.   All other systems reviewed and are negative.    Physical Exam Updated Vital Signs BP 138/66 (BP Location: Left Arm)   Pulse (!) 56   Temp 97.8 F (36.6 C) (Oral)   Resp 17   SpO2 100%   Physical Exam  Constitutional: She is oriented to person, place, and time. She appears well-developed and well-nourished. She appears distressed.  Appears mildly uncomfortable Glasgow Coma Score 15  HENT:  Head: Normocephalic and atraumatic.  Eyes: Conjunctivae are normal. Pupils are equal, round, and reactive to light.  Neck: Neck supple. No tracheal deviation present. No thyromegaly present.  Nontender posteriorly  Cardiovascular: Normal rate and regular rhythm.   No murmur heard. Pulmonary/Chest: Effort normal and breath sounds normal. She exhibits tenderness.  Tender at right upper chest anteriorly, no crepitance or flail or ecchymosis  Abdominal: Soft. Bowel sounds are normal. She exhibits no distension. There is no tenderness.  Musculoskeletal: Normal range of motion. She exhibits no edema or tenderness.  Right upper extremity skin intact swollen and tender Deltoid. Radial pulse 2+ good capillary refill. Entire spine nontender. Pelvis stable nontender. All other extremities without contusion abrasion or tenderness neurovascularly intact    Neurological: She is alert and oriented to person, place, and time. Coordination normal.  Skin: Skin is warm and dry. No rash noted.  Psychiatric: She has a normal mood and affect.  Nursing note and vitals reviewed.    ED Treatments / Results  Labs (all labs ordered are listed, but only abnormal results are displayed) Labs Reviewed - No data to display  EKG  EKG Interpretation None       Radiology No results found.  Procedures Procedures (including critical care time)  Medications Ordered in ED Medications  morphine 4 MG/ML injection 4 mg (not administered)     Initial Impression / Assessment and Plan / ED Course  I have reviewed the triage vital signs and the nursing notes.  Pertinent labs & imaging results that were available during my care of the patient were reviewed by me and considered in my medical decision making (see chart for details). X-rays viewed by me Results for orders placed or performed during the hospital encounter of 03/20/14  Glucose, capillary  Result Value Ref Range   Glucose-Capillary 97 70 - 99 mg/dL   Dg Ribs Unilateral W/chest Right  Result Date: 10/10/2016 CLINICAL DATA:  Fall today, pt states that she was walking and tripped and fell; most pain in right upper arm; SOB due to pain; no known cardiopulmonary problems; HTN; non smoker; pt was not able to obtain better AP right humerus due to pt condition, best obtainable EXAM: RIGHT RIBS AND CHEST - 3+ VIEW COMPARISON:  Chest x-ray 12/13/2015 heart is mildly enlarged. There is FINDINGS: No focal consolidation or pleural effusion. No pulmonary edema. No pneumothorax. Minimal scarring in the left mid lung zone appear stable. Comminuted fracture of the right humeral head. Oblique views demonstrate no acute rib fracture. Note is made of calcified gallstone. IMPRESSION: 1. Comminuted fracture of the right humeral head. 2. Cholelithiasis. Electronically Signed   By: Nolon Nations M.D.   On:  10/10/2016 16:59   Dg Humerus Right  Result Date: 10/10/2016 CLINICAL DATA:  Fall today, pt states that she was walking and tripped and fell; most pain in right upper arm; SOB due to pain; no known cardiopulmonary problems; HTN; non smoker; pt was not able to obtain better AP right humerus due to pt condition, best obtainable EXAM: RIGHT HUMERUS - 2+ VIEW COMPARISON:  None. FINDINGS: There is a comminuted fracture of the right humeral head, associated with impaction. Positioning is nonstandard secondary to pain and patient's limitations. The position of the humeral head overlies the glenoid fossa on the views provided. However a true orthogonal view has not been performed. IMPRESSION: Comminuted fracture of the right humeral head. Electronically Signed   By: Nolon Nations M.D.   On: 10/10/2016 16:55   Clinical Course     Patient became nauseated after treatment with intravenous morphine. At 8:30 PM she is alert and ambulatory and feels ready to go home after  treatment with intravenous morphine, shoulder mobilizer is on patient and she is comfortable. I've consulted Dr. Ninfa Linden via telephone. Plan prescription Norco, Reglan. Call office in 4 days to arrange to be seen in follow-up   Final Clinical Impressions(s) / ED Diagnoses   diagnosis #1 fall  #2 fracture of right proximal humerus  #3 contusion to chest wall  Final diagnoses:  None    New Prescriptions New Prescriptions   No medications on file     Orlie Dakin, MD 10/10/16 2035

## 2016-10-10 NOTE — ED Notes (Signed)
Patient d/c'd in the care of family.  F/U and medications reviewed.  Patient verbalized understanding.

## 2016-10-14 ENCOUNTER — Ambulatory Visit (INDEPENDENT_AMBULATORY_CARE_PROVIDER_SITE_OTHER): Payer: Medicare Other

## 2016-10-14 ENCOUNTER — Encounter (INDEPENDENT_AMBULATORY_CARE_PROVIDER_SITE_OTHER): Payer: Self-pay | Admitting: Orthopaedic Surgery

## 2016-10-14 ENCOUNTER — Ambulatory Visit (INDEPENDENT_AMBULATORY_CARE_PROVIDER_SITE_OTHER): Payer: Medicare Other | Admitting: Orthopaedic Surgery

## 2016-10-14 DIAGNOSIS — S42291A Other displaced fracture of upper end of right humerus, initial encounter for closed fracture: Secondary | ICD-10-CM

## 2016-10-14 DIAGNOSIS — S42201A Unspecified fracture of upper end of right humerus, initial encounter for closed fracture: Secondary | ICD-10-CM | POA: Insufficient documentation

## 2016-10-14 NOTE — Progress Notes (Signed)
   Office Visit Note   Patient: [name]           Date of Birth: May 27, 1936           MRN: [id number] Visit Date: 10/14/2016              Requested by: Deland Pretty, MD [address] [place], [place] [address] PCP: Horatio Pel, MD   Assessment & Plan: Visit Diagnoses:  1. Other closed displaced fracture of proximal end of right humerus, initial encounter     Plan: displaced 4 part proximal humerus fracture.  May benefit from reverse shoulder replacement which I do not perform.  Urgent referral to Dr. Tamera Punt at [facility] made.    Follow-Up Instructions: Return if symptoms worsen or fail to improve.   Orders:  Orders Placed This Encounter  Procedures  . XR Shoulder Right  . Ambulatory referral to Orthopedic Surgery   No orders of the defined types were placed in this encounter.     Procedures: No procedures performed   Clinical Data: No additional findings.   Subjective: No chief complaint on file.   81 yo [place] female presents today for right proximal humerus fracture s/p fall on 10/10/16.  Was evaluated in the ER and placed in sling and given f/u appt with [place].  Pain is 5/10 and endorses tingling.  Pain doesn't radiate.    Review of Systems  All other systems reviewed and are negative.    Objective: Vital Signs: There were no vitals taken for this visit.  Physical Exam  Constitutional: She is oriented to person, place, and time. She appears well-developed and well-nourished.  Pulmonary/Chest: Effort normal.  Neurological: She is alert and oriented to person, place, and time.  Skin: Skin is warm. Capillary refill takes less than 2 seconds.  Psychiatric: She has a normal mood and affect. Her behavior is normal. Judgment and thought content normal.  Nursing note and vitals reviewed.   Ortho Exam No significant bruising.  NVI distally.  ROM not tested. Specialty Comments:  No specialty comments available.  Imaging: Xr  Shoulder Right  Result Date: 10/14/2016 Displaced 4 part proximal humerus fracture    PMFS History: Patient Active Problem List   Diagnosis Date Noted  . Closed fracture of right proximal humerus 10/14/2016  . Coronary artery calcification 04/03/2014  . Right maxillary sinusitis, chronic 04/03/2014  . Lung nodule, solitary 03/10/2014   Past Medical History:  Diagnosis Date  . Gout   . High cholesterol   . Hypertension     History reviewed. No pertinent family history.  Past Surgical History:  Procedure Laterality Date  . FEMUR FRACTURE SURGERY     rod in right thigh  . TUBAL LIGATION     Social History   Occupational History  . retired    Social History Main Topics  . Smoking status: Never Smoker  . Smokeless tobacco: Never Used  . Alcohol use No  . Drug use: No  . Sexual activity: Not on file

## 2016-10-15 ENCOUNTER — Encounter (HOSPITAL_COMMUNITY): Payer: Self-pay | Admitting: *Deleted

## 2016-10-15 ENCOUNTER — Other Ambulatory Visit: Payer: Self-pay | Admitting: Orthopedic Surgery

## 2016-10-15 DIAGNOSIS — M25511 Pain in right shoulder: Secondary | ICD-10-CM | POA: Diagnosis not present

## 2016-10-15 NOTE — Progress Notes (Signed)
Unable to reach pt by phone. Left pre-op instructions according to Pre-op Call Checklist on pt's voicemail. Pt instructed to arrive at 5:30 AM tomorrow.

## 2016-10-15 NOTE — Anesthesia Preprocedure Evaluation (Addendum)
Anesthesia Evaluation  Patient identified by MRN, date of birth, ID band Patient awake    Reviewed: Allergy & Precautions, NPO status , Patient's Chart, lab work & pertinent test results  History of Anesthesia Complications Negative for: history of anesthetic complications  Airway Mallampati: I  TM Distance: >3 FB Neck ROM: Full    Dental  (+) Caps, Implants, Dental Advisory Given   Pulmonary neg pulmonary ROS,    breath sounds clear to auscultation       Cardiovascular hypertension, Pt. on medications and Pt. on home beta blockers (-) angina Rhythm:Regular Rate:Normal     Neuro/Psych negative neurological ROS     GI/Hepatic Neg liver ROS, GERD  Medicated and Controlled,  Endo/Other  Hypothyroidism   Renal/GU negative Renal ROS     Musculoskeletal   Abdominal   Peds  Hematology negative hematology ROS (+)   Anesthesia Other Findings   Reproductive/Obstetrics                           Anesthesia Physical Anesthesia Plan  ASA: III  Anesthesia Plan: General   Post-op Pain Management: GA combined w/ Regional for post-op pain   Induction: Intravenous  Airway Management Planned: Oral ETT  Additional Equipment:   Intra-op Plan:   Post-operative Plan: Extubation in OR  Informed Consent: I have reviewed the patients History and Physical, chart, labs and discussed the procedure including the risks, benefits and alternatives for the proposed anesthesia with the patient or authorized representative who has indicated his/her understanding and acceptance.   Dental advisory given  Plan Discussed with: CRNA and Surgeon  Anesthesia Plan Comments: (Plan routine monitors, GETA with interscalene block for post op analgesia)        Anesthesia Quick Evaluation

## 2016-10-16 ENCOUNTER — Inpatient Hospital Stay (HOSPITAL_COMMUNITY): Payer: Medicare Other

## 2016-10-16 ENCOUNTER — Inpatient Hospital Stay (HOSPITAL_COMMUNITY)
Admission: RE | Admit: 2016-10-16 | Discharge: 2016-10-18 | DRG: 483 | Disposition: A | Discharge status: Home / Self Care | Payer: Medicare Other | Source: Ambulatory Visit | Attending: Orthopedic Surgery | Admitting: Orthopedic Surgery

## 2016-10-16 ENCOUNTER — Encounter (HOSPITAL_COMMUNITY): Payer: Self-pay | Admitting: Anesthesiology

## 2016-10-16 ENCOUNTER — Encounter (HOSPITAL_COMMUNITY)
Admission: RE | Disposition: A | Discharge status: Home / Self Care | Payer: Self-pay | Source: Ambulatory Visit | Attending: Orthopedic Surgery

## 2016-10-16 ENCOUNTER — Ambulatory Visit (HOSPITAL_COMMUNITY): Payer: Medicare Other | Admitting: Anesthesiology

## 2016-10-16 DIAGNOSIS — K219 Gastro-esophageal reflux disease without esophagitis: Secondary | ICD-10-CM | POA: Diagnosis not present

## 2016-10-16 DIAGNOSIS — E7800 Pure hypercholesterolemia, unspecified: Secondary | ICD-10-CM | POA: Diagnosis present

## 2016-10-16 DIAGNOSIS — I10 Essential (primary) hypertension: Secondary | ICD-10-CM | POA: Diagnosis present

## 2016-10-16 DIAGNOSIS — M109 Gout, unspecified: Secondary | ICD-10-CM | POA: Diagnosis present

## 2016-10-16 DIAGNOSIS — Z96611 Presence of right artificial shoulder joint: Secondary | ICD-10-CM | POA: Diagnosis not present

## 2016-10-16 DIAGNOSIS — I959 Hypotension, unspecified: Secondary | ICD-10-CM | POA: Diagnosis not present

## 2016-10-16 DIAGNOSIS — S42201A Unspecified fracture of upper end of right humerus, initial encounter for closed fracture: Principal | ICD-10-CM | POA: Diagnosis present

## 2016-10-16 DIAGNOSIS — W19XXXA Unspecified fall, initial encounter: Secondary | ICD-10-CM | POA: Diagnosis present

## 2016-10-16 DIAGNOSIS — S42291A Other displaced fracture of upper end of right humerus, initial encounter for closed fracture: Secondary | ICD-10-CM | POA: Diagnosis not present

## 2016-10-16 DIAGNOSIS — Z79899 Other long term (current) drug therapy: Secondary | ICD-10-CM | POA: Diagnosis not present

## 2016-10-16 DIAGNOSIS — E039 Hypothyroidism, unspecified: Secondary | ICD-10-CM | POA: Diagnosis present

## 2016-10-16 DIAGNOSIS — G8918 Other acute postprocedural pain: Secondary | ICD-10-CM | POA: Diagnosis not present

## 2016-10-16 DIAGNOSIS — Z471 Aftercare following joint replacement surgery: Secondary | ICD-10-CM | POA: Diagnosis not present

## 2016-10-16 HISTORY — DX: Pneumonia, unspecified organism: J18.9

## 2016-10-16 HISTORY — PX: REVERSE SHOULDER ARTHROPLASTY: SHX5054

## 2016-10-16 HISTORY — DX: Hypothyroidism, unspecified: E03.9

## 2016-10-16 LAB — BASIC METABOLIC PANEL
Anion gap: 8 (ref 5–15)
BUN: 17 mg/dL (ref 6–20)
CHLORIDE: 106 mmol/L (ref 101–111)
CO2: 23 mmol/L (ref 22–32)
Calcium: 8.5 mg/dL — ABNORMAL LOW (ref 8.9–10.3)
Creatinine, Ser: 1.04 mg/dL — ABNORMAL HIGH (ref 0.44–1.00)
GFR calc Af Amer: 58 mL/min — ABNORMAL LOW (ref >60)
GFR calc non Af Amer: 50 mL/min — ABNORMAL LOW (ref >60)
Glucose, Bld: 101 mg/dL — ABNORMAL HIGH (ref 65–99)
Potassium: 3.7 mmol/L (ref 3.5–5.1)
SODIUM: 137 mmol/L (ref 135–145)

## 2016-10-16 LAB — TYPE AND SCREEN
ABO/RH(D): AB POS
ANTIBODY SCREEN: NEGATIVE

## 2016-10-16 LAB — CBC WITH DIFFERENTIAL/PLATELET
Basophils Absolute: 0 10*3/uL (ref 0.0–0.1)
Basophils Relative: 0 %
Eosinophils Absolute: 0.2 10*3/uL (ref 0.0–0.7)
Eosinophils Relative: 2 %
HEMATOCRIT: 31.1 % — AB (ref 36.0–46.0)
HEMOGLOBIN: 10.5 g/dL — AB (ref 12.0–15.0)
LYMPHS ABS: 1.5 10*3/uL (ref 0.7–4.0)
LYMPHS PCT: 20 %
MCH: 31.9 pg (ref 26.0–34.0)
MCHC: 33.8 g/dL (ref 30.0–36.0)
MCV: 94.5 fL (ref 78.0–100.0)
Monocytes Absolute: 0.7 10*3/uL (ref 0.1–1.0)
Monocytes Relative: 9 %
NEUTROS PCT: 69 %
Neutro Abs: 5.4 10*3/uL (ref 1.7–7.7)
Platelets: 282 10*3/uL (ref 150–400)
RBC: 3.29 MIL/uL — AB (ref 3.87–5.11)
RDW: 13.6 % (ref 11.5–15.5)
WBC: 7.7 10*3/uL (ref 4.0–10.5)

## 2016-10-16 LAB — SURGICAL PCR SCREEN
MRSA, PCR: NEGATIVE
STAPHYLOCOCCUS AUREUS: NEGATIVE

## 2016-10-16 LAB — PROTIME-INR
INR: 1.02
Prothrombin Time: 13.4 seconds (ref 11.4–15.2)

## 2016-10-16 LAB — APTT: aPTT: 36 seconds (ref 24–36)

## 2016-10-16 LAB — ABO/RH: ABO/RH(D): AB POS

## 2016-10-16 SURGERY — ARTHROPLASTY, SHOULDER, TOTAL, REVERSE
Anesthesia: Regional | Site: Shoulder | Laterality: Right

## 2016-10-16 MED ORDER — HYDROMORPHONE HCL 1 MG/ML IJ SOLN
INTRAMUSCULAR | Status: AC
  Filled 2016-10-16: qty 0.5

## 2016-10-16 MED ORDER — LABETALOL HCL 100 MG PO TABS
100.0000 mg | ORAL_TABLET | Freq: Two times a day (BID) | ORAL | Status: DC
  Administered 2016-10-16 – 2016-10-18 (×2): 100 mg via ORAL
  Filled 2016-10-16 (×4): qty 1

## 2016-10-16 MED ORDER — PROPOFOL 10 MG/ML IV BOLUS
INTRAVENOUS | Status: DC | PRN
  Administered 2016-10-16: 100 mg via INTRAVENOUS

## 2016-10-16 MED ORDER — SODIUM CHLORIDE 0.9 % IV SOLN
INTRAVENOUS | Status: DC
  Administered 2016-10-16: 15:00:00 via INTRAVENOUS

## 2016-10-16 MED ORDER — HYDROMORPHONE HCL 1 MG/ML IJ SOLN
INTRAMUSCULAR | Status: AC
  Administered 2016-10-16: 0.5 mg via INTRAVENOUS
  Filled 2016-10-16: qty 0.5

## 2016-10-16 MED ORDER — PHENYLEPHRINE HCL 10 MG/ML IJ SOLN
INTRAMUSCULAR | Status: DC | PRN
  Administered 2016-10-16: 50 ug/min via INTRAVENOUS

## 2016-10-16 MED ORDER — ONDANSETRON HCL 4 MG/2ML IJ SOLN: 4.0000 mg | Freq: Four times a day (QID) | INTRAMUSCULAR | Status: DC | PRN

## 2016-10-16 MED ORDER — CEFAZOLIN SODIUM-DEXTROSE 2-4 GM/100ML-% IV SOLN
2.0000 g | INTRAVENOUS | Status: AC
  Administered 2016-10-16: 2 g via INTRAVENOUS
  Filled 2016-10-16: qty 100

## 2016-10-16 MED ORDER — FLEET ENEMA 7-19 GM/118ML RE ENEM: 1.0000 | ENEMA | Freq: Once | RECTAL | Status: DC | PRN

## 2016-10-16 MED ORDER — EPHEDRINE SULFATE 50 MG/ML IJ SOLN
INTRAMUSCULAR | Status: DC | PRN
  Administered 2016-10-16: 10 mg via INTRAVENOUS

## 2016-10-16 MED ORDER — ALLOPURINOL 100 MG PO TABS
200.0000 mg | ORAL_TABLET | Freq: Every day | ORAL | Status: DC
  Administered 2016-10-17 – 2016-10-18 (×2): 200 mg via ORAL
  Filled 2016-10-16 (×2): qty 2

## 2016-10-16 MED ORDER — LEVOTHYROXINE SODIUM 88 MCG PO TABS
88.0000 ug | ORAL_TABLET | Freq: Every day | ORAL | Status: DC
  Administered 2016-10-17 – 2016-10-18 (×2): 88 ug via ORAL
  Filled 2016-10-16 (×2): qty 1

## 2016-10-16 MED ORDER — CEFAZOLIN IN D5W 1 GM/50ML IV SOLN
1.0000 g | Freq: Four times a day (QID) | INTRAVENOUS | Status: AC
  Administered 2016-10-16 – 2016-10-17 (×3): 1 g via INTRAVENOUS
  Filled 2016-10-16 (×3): qty 50

## 2016-10-16 MED ORDER — LEVOBUNOLOL HCL 0.5 % OP SOLN
1.0000 [drp] | Freq: Every day | OPHTHALMIC | Status: DC
  Administered 2016-10-17 – 2016-10-18 (×2): 1 [drp] via OPHTHALMIC
  Filled 2016-10-16: qty 5

## 2016-10-16 MED ORDER — ALUM & MAG HYDROXIDE-SIMETH 200-200-20 MG/5ML PO SUSP: 30.0000 mL | ORAL | Status: DC | PRN

## 2016-10-16 MED ORDER — POLYETHYLENE GLYCOL 3350 17 G PO PACK: 17.0000 g | PACK | Freq: Every day | ORAL | Status: DC | PRN

## 2016-10-16 MED ORDER — BISACODYL 5 MG PO TBEC: 5.0000 mg | DELAYED_RELEASE_TABLET | Freq: Every day | ORAL | Status: DC | PRN

## 2016-10-16 MED ORDER — MENTHOL 3 MG MT LOZG: 1.0000 | LOZENGE | OROMUCOSAL | Status: DC | PRN

## 2016-10-16 MED ORDER — MEPERIDINE HCL 25 MG/ML IJ SOLN: 6.2500 mg | INTRAMUSCULAR | Status: DC | PRN

## 2016-10-16 MED ORDER — METOCLOPRAMIDE HCL 5 MG PO TABS: 5.0000 mg | ORAL_TABLET | Freq: Three times a day (TID) | ORAL | Status: DC | PRN

## 2016-10-16 MED ORDER — METOCLOPRAMIDE HCL 5 MG/ML IJ SOLN: 5.0000 mg | Freq: Three times a day (TID) | INTRAMUSCULAR | Status: DC | PRN

## 2016-10-16 MED ORDER — PROMETHAZINE HCL 25 MG/ML IJ SOLN: 6.2500 mg | INTRAMUSCULAR | Status: DC | PRN

## 2016-10-16 MED ORDER — HYDROMORPHONE HCL 1 MG/ML IJ SOLN
0.2500 mg | INTRAMUSCULAR | Status: DC | PRN
  Administered 2016-10-16 (×2): 0.5 mg via INTRAVENOUS

## 2016-10-16 MED ORDER — LIDOCAINE HCL (CARDIAC) 20 MG/ML IV SOLN
INTRAVENOUS | Status: DC | PRN
  Administered 2016-10-16: 50 mg via INTRAVENOUS

## 2016-10-16 MED ORDER — LACTATED RINGERS IV SOLN
INTRAVENOUS | Status: DC | PRN
  Administered 2016-10-16 (×2): via INTRAVENOUS

## 2016-10-16 MED ORDER — WHITE PETROLATUM GEL
Status: AC
  Administered 2016-10-16: 16:00:00
  Filled 2016-10-16: qty 1

## 2016-10-16 MED ORDER — PHENOL 1.4 % MT LIQD: 1.0000 | OROMUCOSAL | Status: DC | PRN

## 2016-10-16 MED ORDER — HYDROCODONE-ACETAMINOPHEN 5-325 MG PO TABS
ORAL_TABLET | ORAL | Status: AC
  Filled 2016-10-16: qty 2

## 2016-10-16 MED ORDER — ONDANSETRON HCL 4 MG PO TABS: 4.0000 mg | ORAL_TABLET | Freq: Four times a day (QID) | ORAL | Status: DC | PRN

## 2016-10-16 MED ORDER — FENTANYL CITRATE (PF) 100 MCG/2ML IJ SOLN
INTRAMUSCULAR | Status: AC
  Filled 2016-10-16: qty 2

## 2016-10-16 MED ORDER — SUGAMMADEX SODIUM 200 MG/2ML IV SOLN
INTRAVENOUS | Status: DC | PRN
  Administered 2016-10-16: 139 mg via INTRAVENOUS

## 2016-10-16 MED ORDER — ZOLPIDEM TARTRATE 5 MG PO TABS: 5.0000 mg | ORAL_TABLET | Freq: Every evening | ORAL | Status: DC | PRN

## 2016-10-16 MED ORDER — SODIUM CHLORIDE 0.9 % IR SOLN
Status: DC | PRN
  Administered 2016-10-16: 1000 mL
  Administered 2016-10-16: 3000 mL

## 2016-10-16 MED ORDER — HYDROCODONE-ACETAMINOPHEN 5-325 MG PO TABS
1.0000 | ORAL_TABLET | ORAL | Status: DC | PRN
  Administered 2016-10-16 – 2016-10-18 (×9): 2 via ORAL
  Filled 2016-10-16 (×9): qty 2

## 2016-10-16 MED ORDER — ROCURONIUM BROMIDE 100 MG/10ML IV SOLN
INTRAVENOUS | Status: DC | PRN
  Administered 2016-10-16: 50 mg via INTRAVENOUS

## 2016-10-16 MED ORDER — AMLODIPINE BESYLATE 5 MG PO TABS
5.0000 mg | ORAL_TABLET | Freq: Every day | ORAL | Status: DC
  Administered 2016-10-18: 5 mg via ORAL
  Filled 2016-10-16 (×2): qty 1

## 2016-10-16 MED ORDER — FENTANYL CITRATE (PF) 100 MCG/2ML IJ SOLN
INTRAMUSCULAR | Status: DC | PRN
Start: 2016-10-16 — End: 2016-10-16
  Administered 2016-10-16: 50 ug via INTRAVENOUS

## 2016-10-16 MED ORDER — DIPHENHYDRAMINE HCL 12.5 MG/5ML PO ELIX
12.5000 mg | ORAL_SOLUTION | ORAL | Status: DC | PRN
  Filled 2016-10-16: qty 10

## 2016-10-16 MED ORDER — PROPOFOL 10 MG/ML IV BOLUS
INTRAVENOUS | Status: AC
  Filled 2016-10-16: qty 20

## 2016-10-16 MED ORDER — MORPHINE SULFATE (PF) 2 MG/ML IV SOLN: 1.0000 mg | INTRAVENOUS | Status: DC | PRN

## 2016-10-16 MED ORDER — DOCUSATE SODIUM 100 MG PO CAPS
100.0000 mg | ORAL_CAPSULE | Freq: Two times a day (BID) | ORAL | Status: DC
  Administered 2016-10-16 – 2016-10-18 (×5): 100 mg via ORAL
  Filled 2016-10-16 (×5): qty 1

## 2016-10-16 MED ORDER — MIDAZOLAM HCL 2 MG/2ML IJ SOLN: 0.5000 mg | Freq: Once | INTRAMUSCULAR | Status: DC | PRN

## 2016-10-16 MED ORDER — BUPIVACAINE-EPINEPHRINE (PF) 0.5% -1:200000 IJ SOLN
INTRAMUSCULAR | Status: DC | PRN
  Administered 2016-10-16: 30 mL via PERINEURAL

## 2016-10-16 SURGICAL SUPPLY — 82 items
AID PSTN UNV HD RSTRNT DISP (MISCELLANEOUS) ×1
BASEPLATE P2 COATD GLND 6.5X30 (Shoulder) IMPLANT
BIT DRILL 5/64X5 DISP (BIT) ×1 IMPLANT
BLADE SAW SAG 73X25 THK (BLADE) ×1
BLADE SAW SGTL 73X25 THK (BLADE) ×1 IMPLANT
BLADE SURG 15 STRL LF DISP TIS (BLADE) IMPLANT
BLADE SURG 15 STRL SS (BLADE)
BOWL SMART MIX CTS (DISPOSABLE) ×1 IMPLANT
BSPLAT GLND 30 STRL LF SHLDR (Shoulder) ×1 IMPLANT
CEMENT BONE DEPUY (Cement) ×1 IMPLANT
CHLORAPREP W/TINT 26ML (MISCELLANEOUS) ×2 IMPLANT
COVER MAYO STAND STRL (DRAPES) IMPLANT
COVER SURGICAL LIGHT HANDLE (MISCELLANEOUS) ×2 IMPLANT
DRAPE INCISE IOBAN 66X45 STRL (DRAPES) ×2 IMPLANT
DRAPE ORTHO SPLIT 77X108 STRL (DRAPES) ×4
DRAPE SURG ORHT 6 SPLT 77X108 (DRAPES) ×2 IMPLANT
DRSG AQUACEL AG ADV 3.5X10 (GAUZE/BANDAGES/DRESSINGS) ×2 IMPLANT
ELECT BLADE 4.0 EZ CLEAN MEGAD (MISCELLANEOUS) ×2
ELECT REM PT RETURN 9FT ADLT (ELECTROSURGICAL) ×2
ELECTRODE BLDE 4.0 EZ CLN MEGD (MISCELLANEOUS) ×1 IMPLANT
ELECTRODE REM PT RTRN 9FT ADLT (ELECTROSURGICAL) ×1 IMPLANT
EVACUATOR 1/8 PVC DRAIN (DRAIN) IMPLANT
GLOVE BIO SURGEON STRL SZ7 (GLOVE) ×2 IMPLANT
GLOVE BIO SURGEON STRL SZ7.5 (GLOVE) ×2 IMPLANT
GLOVE BIOGEL PI IND STRL 7.0 (GLOVE) ×1 IMPLANT
GLOVE BIOGEL PI IND STRL 8 (GLOVE) ×1 IMPLANT
GLOVE BIOGEL PI INDICATOR 7.0 (GLOVE) ×1
GLOVE BIOGEL PI INDICATOR 8 (GLOVE) ×1
GOWN STRL REUS W/ TWL LRG LVL3 (GOWN DISPOSABLE) ×3 IMPLANT
GOWN STRL REUS W/ TWL XL LVL3 (GOWN DISPOSABLE) ×1 IMPLANT
GOWN STRL REUS W/TWL LRG LVL3 (GOWN DISPOSABLE) ×6
GOWN STRL REUS W/TWL XL LVL3 (GOWN DISPOSABLE) ×4
HANDPIECE INTERPULSE COAX TIP (DISPOSABLE) ×2
HOOD PEEL AWAY FLYTE STAYCOOL (MISCELLANEOUS) ×4 IMPLANT
INSERT EPOLY STND HUMERUS 32MM (Shoulder) ×2 IMPLANT
INSERT EPOLYSTD HUMERUS 32MM (Shoulder) IMPLANT
KIT BASIN OR (CUSTOM PROCEDURE TRAY) ×2 IMPLANT
KIT ROOM TURNOVER OR (KITS) ×2 IMPLANT
MANIFOLD NEPTUNE II (INSTRUMENTS) ×2 IMPLANT
NDL HYPO 25GX1X1/2 BEV (NEEDLE) IMPLANT
NDL MAYO TROCAR (NEEDLE) IMPLANT
NEEDLE HYPO 25GX1X1/2 BEV (NEEDLE) IMPLANT
NEEDLE MAYO TROCAR (NEEDLE) ×2 IMPLANT
NOZZLE PRISM 8.5MM (MISCELLANEOUS) IMPLANT
NS IRRIG 1000ML POUR BTL (IV SOLUTION) ×2 IMPLANT
P2 COATDE GLNOID BSEPLT 6.5X30 (Shoulder) ×2 IMPLANT
PACK SHOULDER (CUSTOM PROCEDURE TRAY) ×2 IMPLANT
PAD ARMBOARD 7.5X6 YLW CONV (MISCELLANEOUS) ×4 IMPLANT
RESTRAINT HEAD UNIVERSAL NS (MISCELLANEOUS) ×2 IMPLANT
RETRIEVER SUT HEWSON (MISCELLANEOUS) ×1 IMPLANT
SCREW BONE LOCKING RSP 5.0X14 (Screw) ×2 IMPLANT
SCREW BONE RSP LOCK 5X14 (Screw) IMPLANT
SCREW BONE RSP LOCK 5X22 (Screw) IMPLANT
SCREW BONE RSP LOCK 5X26 (Screw) IMPLANT
SCREW BONE RSP LOCKING 5.0X26 (Screw) ×4 IMPLANT
SCREW BONE RSP LOCKING 5.0X32 (Screw) ×2 IMPLANT
SCREW RETAIN W/HEAD 4MM OFFSET (Shoulder) ×1 IMPLANT
SET HNDPC FAN SPRY TIP SCT (DISPOSABLE) ×1 IMPLANT
SLING ARM FOAM STRAP LRG (SOFTGOODS) ×1 IMPLANT
SLING ARM FOAM STRAP MED (SOFTGOODS) ×1 IMPLANT
SPONGE LAP 18X18 X RAY DECT (DISPOSABLE) ×1 IMPLANT
SPONGE LAP 4X18 X RAY DECT (DISPOSABLE) IMPLANT
STEM REV PRIMARY 8X108 (Shoulder) ×1 IMPLANT
STRIP CLOSURE SKIN 1/2X4 (GAUZE/BANDAGES/DRESSINGS) ×2 IMPLANT
SUCTION FRAZIER HANDLE 10FR (MISCELLANEOUS) ×1
SUCTION TUBE FRAZIER 10FR DISP (MISCELLANEOUS) ×1 IMPLANT
SUPPORT WRAP ARM LG (MISCELLANEOUS) ×1 IMPLANT
SUT ETHIBOND NAB CT1 #1 30IN (SUTURE) ×4 IMPLANT
SUT FIBERWIRE #2 38 T-5 BLUE (SUTURE) ×12
SUT MNCRL AB 4-0 PS2 18 (SUTURE) ×2 IMPLANT
SUT SILK 2 0 TIES 17X18 (SUTURE) ×2
SUT SILK 2-0 18XBRD TIE BLK (SUTURE) IMPLANT
SUT VIC AB 2-0 CT1 27 (SUTURE) ×4
SUT VIC AB 2-0 CT1 TAPERPNT 27 (SUTURE) ×1 IMPLANT
SUTURE FIBERWR #2 38 T-5 BLUE (SUTURE) ×1 IMPLANT
SYR CONTROL 10ML LL (SYRINGE) IMPLANT
SYRINGE TOOMEY DISP (SYRINGE) IMPLANT
TAPE FIBER 2MM 7IN #2 BLUE (SUTURE) IMPLANT
TOWEL OR 17X24 6PK STRL BLUE (TOWEL DISPOSABLE) ×2 IMPLANT
TOWEL OR 17X26 10 PK STRL BLUE (TOWEL DISPOSABLE) ×2 IMPLANT
WATER STERILE IRR 1000ML POUR (IV SOLUTION) ×2 IMPLANT
YANKAUER SUCT BULB TIP NO VENT (SUCTIONS) ×1 IMPLANT

## 2016-10-16 NOTE — Anesthesia Procedure Notes (Signed)
Procedure Name: Intubation Date/Time: 10/16/2016 11:06 AM Performed by: Neldon Newport Pre-anesthesia Checklist: Timeout performed, Patient being monitored, Emergency Drugs available, Suction available and Patient identified Patient Re-evaluated:Patient Re-evaluated prior to inductionOxygen Delivery Method: Circle system utilized Preoxygenation: Pre-oxygenation with 100% oxygen Intubation Type: IV induction Ventilation: Mask ventilation without difficulty Laryngoscope Size: Mac and 4 Grade View: Grade II Tube type: Oral Tube size: 7.0 mm Number of attempts: 2 Placement Confirmation: breath sounds checked- equal and bilateral,  positive ETCO2 and ETT inserted through vocal cords under direct vision Secured at: 21 cm Tube secured with: Tape Dental Injury: Teeth and Oropharynx as per pre-operative assessment

## 2016-10-16 NOTE — Anesthesia Procedure Notes (Signed)
Anesthesia Regional Block:  Interscalene brachial plexus block  Pre-Anesthetic Checklist: ,, timeout performed, Correct Patient, Correct Site, Correct Laterality, Correct Procedure, Correct Position, site marked, Risks and benefits discussed,  Surgical consent,  Pre-op evaluation,  At surgeon's request and post-op pain management  Laterality: Right and Upper  Prep: chloraprep       Needles:  Injection technique: Single-shot  Needle Type: Stimulator Needle - 40     Needle Length: 4cm 4 cm Needle Gauge: 21 and 21 G    Additional Needles:  Procedures: nerve stimulator Interscalene brachial plexus block  Nerve Stimulator or Paresthesia:  Response: forearm twitch, 0.45 mA, 0.1 ms,   Additional Responses:   Narrative:  Start time: 10/16/2016 7:03 AM End time: 10/16/2016 7:06 AM Injection made incrementally with aspirations every 5 mL.  Performed by: Personally  Anesthesiologist: Glennon Mac, Shellby Schlink  Additional Notes: Pt identified in Holding room.  Monitors applied. Working IV access confirmed. Sterile prep R neck.  #22ga PNS to forearm twitch at 0.48m threshold.  30cc 0.5% Bupivacaine with 1:200k epi injected incrementally after negative test dose.  Patient asymptomatic, VSS, no heme aspirated, tolerated well.  CJenita Seashore MD

## 2016-10-16 NOTE — H&P (Signed)
[name] is an 81 y.o. female.   Chief Complaint: R shoulder injury HPI: s/p fall with R comminuted proximal humerus fracture.    Past Medical History:  Diagnosis Date  . Gout   . High cholesterol   . Hypertension   . Hypothyroidism     Past Surgical History:  Procedure Laterality Date  . FEMUR FRACTURE SURGERY     rod in right thigh  . TUBAL LIGATION      History reviewed. No pertinent family history. Social History:  reports that she has never smoked. She has never used smokeless tobacco. She reports that she does not drink alcohol or use drugs.  Allergies:  Allergies  Allergen Reactions  . Sulfa Antibiotics     UNSPECIFIED REACTION   . Asa [Aspirin] Other (See Comments)    Reaction:  GI upset and headaches     Medications Prior to Admission  Medication Sig Dispense Refill  . allopurinol (ZYLOPRIM) 100 MG tablet Take 200 mg by mouth daily.     Marland Kitchen amLODipine (NORVASC) 5 MG tablet Take 5 mg by mouth daily.     Marland Kitchen HYDROcodone-acetaminophen (NORCO) 5-325 MG tablet Take 1 tablet by mouth every 6 (six) hours as needed for severe pain. 25 tablet 0  . labetalol (NORMODYNE) 200 MG tablet Take 100 mg by mouth 2 (two) times daily.     Marland Kitchen levobunolol (BETAGAN) 0.5 % ophthalmic solution Place 1 drop into both eyes daily.    Marland Kitchen levothyroxine (SYNTHROID, LEVOTHROID) 88 MCG tablet Take 88 mcg by mouth daily before breakfast.     . metoCLOPramide (REGLAN) 10 MG tablet Take 1 tablet (10 mg total) by mouth every 6 (six) hours as needed for nausea (nausea/headache). 1 tablet every 6 hours as needed for nausea 25 tablet 0    Results for orders placed or performed during the hospital encounter of 10/16/16 (from the past 48 hour(s))  CBC WITH DIFFERENTIAL     Status: Abnormal   Collection Time: 10/16/16  6:47 AM  Result Value Ref Range   WBC 7.7 4.0 - 10.5 K/uL   RBC 3.29 (L) 3.87 - 5.11 MIL/uL   Hemoglobin 10.5 (L) 12.0 - 15.0 g/dL   HCT 31.1 (L) 36.0 - 46.0 %   MCV 94.5 78.0 - 100.0 fL    MCH 31.9 26.0 - 34.0 pg   MCHC 33.8 30.0 - 36.0 g/dL   RDW 13.6 11.5 - 15.5 %   Platelets 282 150 - 400 K/uL   Neutrophils Relative % 69 %   Neutro Abs 5.4 1.7 - 7.7 K/uL   Lymphocytes Relative 20 %   Lymphs Abs 1.5 0.7 - 4.0 K/uL   Monocytes Relative 9 %   Monocytes Absolute 0.7 0.1 - 1.0 K/uL   Eosinophils Relative 2 %   Eosinophils Absolute 0.2 0.0 - 0.7 K/uL   Basophils Relative 0 %   Basophils Absolute 0.0 0.0 - 0.1 K/uL   Xr Shoulder Right  Result Date: 10/14/2016 Displaced 4 part proximal humerus fracture   Review of Systems  All other systems reviewed and are negative.   Blood pressure 130/76, pulse 93, temperature 98.9 F (37.2 C), temperature source Oral, resp. rate 18, SpO2 100 %. Physical Exam  Constitutional: She is oriented to person, place, and time. She appears well-developed and well-nourished.  HENT:  Head: Atraumatic.  Cardiovascular: Intact distal pulses.   Respiratory: Effort normal.  Musculoskeletal:  R shoulder swollen, TTP, NVID.  Neurological: She is alert and oriented to  person, place, and time.  Skin: Skin is warm and dry.  Psychiatric: She has a normal mood and affect.     Assessment/Plan s/p fall with R comminuted proximal humerus fracture.   Indicated for reverse TSA for pain relief and improved function. Risks / benefits of surgery discussed Consent on chart  NPO for OR Preop antibiotics   Nita Sells, MD 10/16/2016, 7:17 AM

## 2016-10-16 NOTE — Op Note (Signed)
Procedure(s): REVERSE SHOULDER ARTHROPLASTY Procedure Note  [name] female 81 y.o. 10/16/2016  Procedure(s) and Anesthesia Type:    * Right REVERSE SHOULDER ARTHROPLASTY - Choice   Indications:  81 y.o. female  With comminuted R proximal humerus fracture dislocation, indicated for surgery to promote long term pain relief and maximize function.     Surgeon: Nita Sells   Assistants: Jeanmarie Hubert PA-C [facility] was present and scrubbed throughout the procedure and was essential in positioning, retraction, exposure, and closure)  Anesthesia: General endotracheal anesthesia with preoperative interscalene block given by the attending anesthesiologist    Procedure Detail  REVERSE SHOULDER ARTHROPLASTY   Estimated Blood Loss:  200 mL         Drains: none  Blood Given: none          Specimens: none        Complications:  * No complications entered in OR log *         Disposition: PACU - hemodynamically stable.         Condition: stable      OPERATIVE FINDINGS:  A DJO Altivate pressfit hybrid cement reverse total shoulder arthroplasty was placed with a  size 8 stem, a 32-4 glenosphere, and a standard-mm poly insert. The tuberosities were reconstructed around the implant  PROCEDURE: The patient was identified in the preoperative holding area  where I personally marked the operative site after verifying site, side,  and procedure with the patient. An interscalene block given by  the attending anesthesiologist in the holding area and the patient was taken back to the operating room where all extremities were  carefully padded in position after general anesthesia was induced. She  was placed in a beach-chair position and the operative upper extremity was  prepped and draped in a standard sterile fashion. An approximately 10-  cm incision was made from the tip of the coracoid process to the center  point of the humerus at the level of the axilla.  Dissection was carried  down through subcutaneous tissues to the level of the cephalic vein  which was taken laterally with the deltoid. The pectoralis major was  retracted medially. The subdeltoid space was developed and the lateral  edge of the conjoined tendon was identified. The undersurface of  conjoined tendon was palpated and the musculocutaneous nerve was not in  the field. Retractor was placed underneath the conjoined and second  retractor was placed lateral into the deltoid. The fracture fragments were identified and fracture team hematoma was removed. The humeral head fragment was mobilized and excised. The lesser tuberosity fragment and subscapularis were tagged with a suture. The posterior tuberosity and rotator cuff were tagged with a second suture. FiberWire sutures were passed around the bone tendon junction of the greater tuberosity high and low for later repair. Attention was then turned to the glenoid.  The anterior and posterior labrum were  completely excised and the capsule was released circumferentially to  allow for exposure of the glenoid for preparation. The 2.5 mm drill was  placed using the guide in 5-10 inferior angulation and the tap was then advanced in the same hole. Small and large reamers were then used. The tap was then removed and the Metaglene was then screwed in with excellent purchase.  The peripheral guide was then used to drilled measured and filled peripheral locking screws. The size  32-4  glenosphere was then impacted on the [facility] taper and the central screw was placed.   The humerus  was then again exposed and the diaphyseal reamers were used up to sounded the canal. A 8 size implant was selected. It was felt that press-fit would not be reliable and cement would be necessary. The joint was reduced and with this implant it was felt that soft tissue tensioning was appropriate. Therefore, final humeral stem was placed hybrid cement distal and press-fit bone  graft proximal.  And then the trial polyethylene inserts were tested again and the above implant was felt to be the most appropriate for final insertion. The joint was reduced and the tuberosities were repaired around the implant using a total of 6 #2 fiber wires through the holes in the implant and around the bone tendon junction. 2 additional drill holes were made in the anterior proximal shaft with sutures passed to allow vertical tension band as well. The joint was then copiously irrigated with pulse  lavage prior to repair the tuberosities and the wound was then closed. Skin was closed with 2-0 Vicryl in a deep dermal layer and 4-0  Monocryl for skin closure. Steri-Strips were applied. Sterile  dressings were then applied as well as a sling. The patient was allowed  to awaken from general anesthesia, transferred to stretcher, and taken  to recovery room in stable condition.   POSTOPERATIVE PLAN: The patient will be kept in the hospital postoperatively  for pain control, antibiotics and therapy. She will begin hand wrist and elbow motion of right away.

## 2016-10-16 NOTE — Discharge Instructions (Signed)

## 2016-10-16 NOTE — Transfer of Care (Signed)
Immediate Anesthesia Transfer of Care Note  Patient: [name]  Procedure(s) Performed: Procedure(s): REVERSE SHOULDER ARTHROPLASTY (Right)  Patient Location: PACU  Anesthesia Type:General  Level of Consciousness: awake, alert  and oriented  Airway & Oxygen Therapy: Patient Spontanous Breathing and Patient connected to nasal cannula oxygen  Post-op Assessment: Report given to RN, Post -op Vital signs reviewed and stable and Patient moving all extremities X 4  Post vital signs: Reviewed and stable  Last Vitals:  Vitals:   10/16/16 0640 10/16/16 1333  BP: 130/76 (P) 95/60  Pulse: 93 65  Resp: 18 13  Temp: 37.2 C 36.4 C    Last Pain:  Vitals:   10/16/16 0640  TempSrc: Oral  PainSc:          Complications: No apparent anesthesia complications

## 2016-10-17 ENCOUNTER — Encounter (HOSPITAL_COMMUNITY): Payer: Self-pay | Admitting: Orthopedic Surgery

## 2016-10-17 LAB — CBC
HEMATOCRIT: 26.3 % — AB (ref 36.0–46.0)
HEMOGLOBIN: 9 g/dL — AB (ref 12.0–15.0)
MCH: 32.3 pg (ref 26.0–34.0)
MCHC: 34.2 g/dL (ref 30.0–36.0)
MCV: 94.3 fL (ref 78.0–100.0)
Platelets: 240 10*3/uL (ref 150–400)
RBC: 2.79 MIL/uL — ABNORMAL LOW (ref 3.87–5.11)
RDW: 13.6 % (ref 11.5–15.5)
WBC: 9.9 10*3/uL (ref 4.0–10.5)

## 2016-10-17 LAB — BASIC METABOLIC PANEL
Anion gap: 8 (ref 5–15)
BUN: 12 mg/dL (ref 6–20)
CHLORIDE: 103 mmol/L (ref 101–111)
CO2: 20 mmol/L — AB (ref 22–32)
Calcium: 7.4 mg/dL — ABNORMAL LOW (ref 8.9–10.3)
Creatinine, Ser: 0.91 mg/dL (ref 0.44–1.00)
GFR calc non Af Amer: 59 mL/min — ABNORMAL LOW (ref >60)
GLUCOSE: 148 mg/dL — AB (ref 65–99)
Potassium: 3.7 mmol/L (ref 3.5–5.1)
Sodium: 131 mmol/L — ABNORMAL LOW (ref 135–145)

## 2016-10-17 MED ORDER — HYDROCODONE-ACETAMINOPHEN 5-325 MG PO TABS
1.0000 | ORAL_TABLET | ORAL | 0 refills | Status: AC | PRN
Start: 2016-10-17 — End: ?

## 2016-10-17 NOTE — Progress Notes (Signed)
   PATIENT ID: [name]   1 Day Post-Op Procedure(s) (LRB): REVERSE SHOULDER ARTHROPLASTY (Right)  Subjective: Doing well, feeling good. Pain is at a 5 and working on getting better control.   Objective:  Vitals:   10/17/16 0417 10/17/16 0800  BP: 112/64 99/70  Pulse: (!) 56 60  Resp: 18   Temp: 99.8 F (37.7 C)      R UE dressing c/d/i Wiggles fingers, distally NVI  Labs:   Recent Labs  10/16/16 0647  HGB 10.5*   Recent Labs  10/16/16 0647  WBC 7.7  RBC 3.29*  HCT 31.1*  PLT 282   Recent Labs  10/16/16 0647  NA 137  K 3.7  CL 106  CO2 23  BUN 17  CREATININE 1.04*  GLUCOSE 101*  CALCIUM 8.5*    Assessment and Plan: 1 day s/p Right reverse TSA for fracture Sling- OT hand wrist elbow only Family and patient request sticking with norco for pain, and d/c with norco, please call if this isnt controlling her pain D/c home tomorrow once pain is controlled Pre op HGB 10.5- no labs yet today, asymptomatic but slightly hypotensive and we'll monitor hgb for possible transfusion Script in chart Fu with Dr. Tamera Punt in 2 weeks   VTE proph: SCDs, asa allergy

## 2016-10-17 NOTE — Evaluation (Signed)
Occupational Therapy Evaluation Patient Details Name: [name] MRN: [id number] DOB: Nov 07, 1935 Today's Date: 10/17/2016    History of Present Illness 81 y/o female presenting post-op  Right REVERSE SHOULDER ARTHROPLASTY. Pt has a past medical history including Gout; HTN. Pt has a past surgical history that includes Femur fracture surgery (Right); Tubal ligation; and Cataract extraction w/ intraocular lens  implant, bilateral (Bilateral).   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 Pt is currently max assist for ADL and min guard for mobility. Pt will benefit from skilled OT in the acute care setting prior to d/c home to maximize independence and safety in ADL. Pt states that her son and daughter in law will be with her this weekend, and after that she will be alone as the caregiver for her 66 year old mother. Please consider home health services to ensure safety and maintenance of shoulder precautions. Next session to focus on independent dressing/donning sling/exercises.     Follow Up Recommendations  Home health OT;Supervision/Assistance - 24 hour    Equipment Recommendations  3 in 1 bedside commode    Recommendations for Other Services       Precautions / Restrictions Precautions Precautions: Shoulder Type of Shoulder Precautions: Conservative Protocol Shoulder Interventions: Shoulder sling/immobilizer;At all times;Off for dressing/bathing/exercises Precaution Booklet Issued: Yes (comment) Precaution Comments: Shoulder protocol handout reviewd in full Required Braces or Orthoses: Sling Restrictions Weight Bearing Restrictions: Yes RUE Weight Bearing: Non weight bearing      Mobility Bed Mobility Overal bed mobility: Needs Assistance Bed Mobility: Sit to Supine       Sit to supine: Min guard;HOB elevated   General bed mobility comments: Pt got in on left side of bed, and while she did not require physical assist took a VERY  long time to get in. OT suggested entering from the right side so she can go down on her left shoulder to sidelying and roll instead.  Transfers Overall transfer level: Needs assistance Equipment used:  (sling) Transfers: Sit to/from Stand Sit to Stand: Min guard         General transfer comment: min guard for safety. no attempt to push up with RUE    Balance Overall balance assessment: Needs assistance Sitting-balance support: No upper extremity supported;Feet supported Sitting balance-Leahy Scale: Good Sitting balance - Comments: sitting in recliner, and EOB with no back support   Standing balance support: Single extremity supported;No upper extremity supported;During functional activity Standing balance-Leahy Scale: Fair Standing balance comment: Pt doing a little furniture walking, stated "the medicine makes me feel off balance"                            ADL Overall ADL's : Needs assistance/impaired Eating/Feeding: Moderate assistance;Sitting Eating/Feeding Details (indicate cue type and reason): for bilateral hand tasks like cutting Grooming: Wash/dry hands;Standing;Supervision/safety Grooming Details (indicate cue type and reason): sink level Upper Body Bathing: Min guard;Standing Upper Body Bathing Details (indicate cue type and reason): edcuated in technique to safely clean under arm Lower Body Bathing: Min guard;Sitting/lateral leans   Upper Body Dressing : Maximal assistance;Sitting   Lower Body Dressing: Moderate assistance;Sit to/from stand   Toilet Transfer: Supervision/safety;Comfort height toilet;Grab bars Toilet Transfer Details (indicate cue type and reason): Pt asking about grab bars, OT educated that she can have them installed. OT suggesting use of 3 in 1 over toilet instead Toileting- Clothing Manipulation and Hygiene: Supervision/safety;Sit to/from stand  Toileting - Clothing Manipulation Details (indicate cue type and reason): hospital  gown Tub/ Shower Transfer: Tub transfer;Min guard;Shower seat   Functional mobility during ADLs: Min guard General ADL Comments: Pt anxious about going home and providing care for her mother, she has 2 sons but they live out of town and are unable to assist at this time. She will need as much help as possible at home.     Vision Vision Assessment?: No apparent visual deficits   Perception     Praxis      Pertinent Vitals/Pain Pain Assessment: 0-10 Pain Score: 6  Pain Location: R shoulder Pain Descriptors / Indicators: Operative site guarding Pain Intervention(s): Monitored during session;Repositioned;Ice applied     Hand Dominance Right   Extremity/Trunk Assessment Upper Extremity Assessment Upper Extremity Assessment: RUE deficits/detail RUE Deficits / Details: s/p deficits in strength and ROM RUE: Unable to fully assess due to immobilization;Unable to fully assess due to pain RUE Coordination: decreased gross motor   Lower Extremity Assessment Lower Extremity Assessment: Overall WFL for tasks assessed   Cervical / Trunk Assessment Cervical / Trunk Assessment: Normal   Communication Communication Communication: HOH   Cognition Arousal/Alertness: Awake/alert Behavior During Therapy: WFL for tasks assessed/performed Overall Cognitive Status: Within Functional Limits for tasks assessed                     General Comments       Exercises Exercises: Shoulder     Shoulder Instructions Shoulder Instructions Donning/doffing shirt without moving shoulder: Maximal assistance;Patient able to independently direct caregiver Method for sponge bathing under operated UE: Min-guard;Patient able to independently direct caregiver Donning/doffing sling/immobilizer: Moderate assistance;Patient able to independently direct caregiver Correct positioning of sling/immobilizer: Moderate assistance;Patient able to independently direct caregiver ROM for elbow, wrist and digits  of operated UE: Modified independent Sling wearing schedule (on at all times/off for ADL's): Modified independent Proper positioning of operated UE when showering: Minimal assistance;Patient able to independently direct caregiver Positioning of UE while sleeping: Modified independent    Home Living Family/patient expects to be discharged to:: Private residence Living Arrangements: Parent (cares for 52 y/o mother) Available Help at Discharge: Other (Comment) (son and daughter in law are in town for a few days) Type of Home: Other(Comment) (Condo) Home Access: Level entry     Home Layout: Two level;Bed/bath upstairs Alternate Level Stairs-Number of Steps: flight Alternate Level Stairs-Rails: Right;Left;Can reach both Bathroom Shower/Tub: Tub/shower unit;Curtain Shower/tub characteristics: Architectural technologist: Standard     Home Equipment: Shower seat;Hand held shower head          Prior Functioning/Environment Level of Independence: Independent        Comments: is caregiver for mother        OT Problem List: Decreased strength;Decreased range of motion;Decreased activity tolerance;Impaired balance (sitting and/or standing);Decreased safety awareness;Decreased knowledge of precautions;Impaired UE functional use;Pain   OT Treatment/Interventions: Self-care/ADL training;Therapeutic exercise;DME and/or AE instruction;Therapeutic activities;Patient/family education;Balance training    OT Goals(Current goals can be found in the care plan section) Acute Rehab OT Goals Patient Stated Goal: to be safe at home and be able to care for her mom OT Goal Formulation: With patient Time For Goal Achievement: 10/24/16 Potential to Achieve Goals: Good ADL Goals Pt Will Perform Upper Body Bathing: with modified independence;standing (demonstrating proper technique to clean under arm) Pt Will Perform Upper Body Dressing: with modified independence;sitting (dressing operated arm first, and  to don sling) Pt Will Perform Lower Body Dressing: with modified independence;sit  to/from stand Pt/caregiver will Perform Home Exercise Program: Right Upper extremity;With written HEP provided  OT Frequency: Min 3X/week   Barriers to D/C: Decreased caregiver support  Pt is caregiver for 87 y/o mother and does not have other family support       Co-evaluation              End of Session Equipment Utilized During Treatment: Other (comment) (Sling) Nurse Communication: Mobility status  Activity Tolerance: Patient tolerated treatment well Patient left: in bed;with call bell/phone within reach   Time: 1019-1100 OT Time Calculation (min): 41 min Charges:  OT General Charges $OT Visit: 1 Procedure OT Evaluation $OT Eval Moderate Complexity: 1 Procedure OT Treatments $Self Care/Home Management : 8-22 mins $Therapeutic Exercise: 8-22 mins G-Codes:    [name] 10-27-16, 1:59 PM Hulda Humphrey OTR/L [phone]

## 2016-10-17 NOTE — Care Management (Signed)
Patient s/p right reverse shoulder arthroplasty. Has no Home health or DME needs. Patient will discharge home with family on Saturday. Ricki Miller, RN BSN Case Manager [phone]

## 2016-10-18 NOTE — Progress Notes (Signed)
Occupational Therapy Treatment Patient Details Name: [name] MRN: [id number] DOB: 1936/09/07 Today's Date: 10/18/2016    History of present illness 81 y/o female presenting post-op  Right REVERSE SHOULDER ARTHROPLASTY. Pt has a past medical history including Gout; HTN. Pt has a past surgical history that includes Femur fracture surgery (Right); Tubal ligation; and Cataract extraction w/ intraocular lens  implant, bilateral (Bilateral).   OT comments  Pt. Able to complete UB/LB dressing without physical assistance today.  Very motivated to learn compensatory techniques so she can maintain her independence.  Reports today that her mother has respite care for 2 weeks through [facility], and she will now be staying with her son and dtr. In law for those two weeks upon d/c.    Follow Up Recommendations  Home health OT;Supervision/Assistance - 24 hour    Equipment Recommendations  3 in 1 bedside commode    Recommendations for Other Services      Precautions / Restrictions Precautions Precautions: Shoulder Type of Shoulder Precautions: Conservative Protocol Shoulder Interventions: Shoulder sling/immobilizer;At all times;Off for dressing/bathing/exercises Required Braces or Orthoses: Sling Restrictions Weight Bearing Restrictions: Yes RUE Weight Bearing: Non weight bearing       Mobility Bed Mobility Overal bed mobility: Modified Independent Bed Mobility: Supine to Sit     Supine to sit: Modified independent (Device/Increase time)     General bed mobility comments: pt. states she gets in/out of bed on R side.  able to get oob without physical assistance hob flat, no rails  Transfers Overall transfer level: Needs assistance Equipment used: None Transfers: Sit to/from [facility] Sit to Stand: Supervision Stand pivot transfers: Supervision            Balance                                   ADL Overall ADL's : Needs assistance/impaired                  Upper Body Dressing : Set up;Adhering to UE precautions;Cueing for sequencing;Cueing for compensatory techniques;Sitting Upper Body Dressing Details (indicate cue type and reason): pt. able to don/doff brace, and don shirt while incorporating compensatory tech. for management of RUE Lower Body Dressing: Set up;Sit to/from stand   Toilet Transfer: Copy Details (indicate cue type and reason): simulated during in room ambulation to recliner, no LOB noted during transfer [facility] and Hygiene: Supervision/safety;Sit to/from stand Toileting - Clothing Manipulation Details (indicate cue type and reason): simulated during donning of pants while pt. transitioned from sit/stand and pulled over her hips.  no LOB or physical assistance required     Functional mobility during ADLs: Supervision/safety General ADL Comments: today pt. reports that her mother is receiving respite care through the [facility] organization that her mother uses for adult daily care programs.  states she will be there for 2 weeks.  also reports during those 2 weeks she will now be staying with her son and dtr. in law.        Vision                     Perception     Praxis      Cognition   Behavior During Therapy: [facility] for tasks assessed/performed Overall Cognitive Status: Within Functional Limits for tasks assessed  Extremity/Trunk Assessment               Exercises     Shoulder Instructions       General Comments      Pertinent Vitals/ Pain       Pain Assessment: 0-10 (did not rate) Pain Location: right shoulder Pain Descriptors / Indicators: Throbbing Pain Intervention(s): Monitored during session;Repositioned;Ice applied  Home Living                                          Prior Functioning/Environment              Frequency  Min 3X/week        Progress Toward  Goals  OT Goals(current goals can now be found in the care plan section)  Progress towards OT goals: Progressing toward goals     Plan Discharge plan remains appropriate    Co-evaluation                 End of Session Equipment Utilized During Treatment: Other (comment) (sling)   Activity Tolerance Patient tolerated treatment well   Patient Left in chair;with call bell/phone within reach   Nurse Communication          Time: 1100-1130 OT Time Calculation (min): 30 min  Charges: OT General Charges $OT Visit: 1 Procedure OT Treatments $Self Care/Home Management : 23-37 mins  Janice Coffin, COTA/L 10/18/2016, 12:12 PM

## 2016-10-18 NOTE — Progress Notes (Signed)
   PATIENT ID: [name]   2 Day Post-Op Procedure(s) (LRB): REVERSE SHOULDER ARTHROPLASTY (Right)  Subjective: Doing well, feeling good. Pain is at a 4 and improved, ice and meds help, has been using Norco, desires assist from OT prior to D/C, eating drinking and B/B habits NL  Objective:  BP 124/65 (BP Location: Left Arm)   Pulse 62   Temp 98.9 F (37.2 C) (Oral)   Resp 15   SpO2 95%   R UE dressing c/d/i Wiggles fingers, distally NVI  Labs:  CBC Latest Ref Rng & Units 10/17/2016 10/16/2016  WBC 4.0 - 10.5 K/uL 9.9 7.7  Hemoglobin 12.0 - 15.0 g/dL 9.0(L) 10.5(L)  Hematocrit 36.0 - 46.0 % 26.3(L) 31.1(L)  Platelets 150 - 400 K/uL 240 282   CMP Latest Ref Rng & Units 10/17/2016 10/16/2016  Glucose 65 - 99 mg/dL 148(H) 101(H)  BUN 6 - 20 mg/dL 12 17  Creatinine 0.44 - 1.00 mg/dL 0.91 1.04(H)  Sodium 135 - 145 mmol/L 131(L) 137  Potassium 3.5 - 5.1 mmol/L 3.7 3.7  Chloride 101 - 111 mmol/L 103 106  CO2 22 - 32 mmol/L 20(L) 23  Calcium 8.9 - 10.3 mg/dL 7.4(L) 8.5(L)   Assessment and Plan: 2 day s/p Right reverse TSA for fracture Sling- OT hand wrist elbow only Family and patient request sticking with norco for pain, and d/c with norco, please call if this isnt controlling her pain D/c home today after OT visit  Pre op HGB 10.5, now 9.0 and asymptomatic  Scripts in chart, printed ans signed Fu with Dr. Tamera Punt in 2 weeks   VTE proph: SCDs, asa allergy

## 2016-10-22 ENCOUNTER — Encounter (HOSPITAL_COMMUNITY): Payer: Self-pay | Admitting: Orthopedic Surgery

## 2016-10-22 NOTE — Anesthesia Postprocedure Evaluation (Addendum)
Anesthesia Post Note  Patient: [name]  Procedure(s) Performed: Procedure(s) (LRB): REVERSE SHOULDER ARTHROPLASTY (Right)  Patient location during evaluation: PACU Anesthesia Type: Regional Level of consciousness: awake and alert Pain management: pain level controlled Vital Signs Assessment: post-procedure vital signs reviewed and stable Respiratory status: spontaneous breathing, respiratory function stable and patient connected to nasal cannula oxygen Cardiovascular status: stable Postop Assessment: no signs of nausea or vomiting Anesthetic complications: no       Last Vitals:  Vitals:   10/17/16 2200 10/18/16 0559  BP: (!) 108/52 124/65  Pulse: (!) 52 62  Resp:  15  Temp:  37.2 C    Last Pain:  Vitals:   10/18/16 0930  TempSrc:   PainSc: 3                  Stoney Karczewski

## 2016-10-24 DIAGNOSIS — W19XXXD Unspecified fall, subsequent encounter: Secondary | ICD-10-CM | POA: Diagnosis not present

## 2016-10-24 DIAGNOSIS — S42301D Unspecified fracture of shaft of humerus, right arm, subsequent encounter for fracture with routine healing: Secondary | ICD-10-CM | POA: Diagnosis not present

## 2016-10-24 DIAGNOSIS — H409 Unspecified glaucoma: Secondary | ICD-10-CM | POA: Diagnosis not present

## 2016-10-24 DIAGNOSIS — R269 Unspecified abnormalities of gait and mobility: Secondary | ICD-10-CM | POA: Diagnosis not present

## 2016-10-24 DIAGNOSIS — I10 Essential (primary) hypertension: Secondary | ICD-10-CM | POA: Diagnosis not present

## 2016-10-24 DIAGNOSIS — Z96611 Presence of right artificial shoulder joint: Secondary | ICD-10-CM | POA: Diagnosis not present

## 2016-10-31 DIAGNOSIS — S42291D Other displaced fracture of upper end of right humerus, subsequent encounter for fracture with routine healing: Secondary | ICD-10-CM | POA: Diagnosis not present

## 2016-10-31 DIAGNOSIS — Z471 Aftercare following joint replacement surgery: Secondary | ICD-10-CM | POA: Diagnosis not present

## 2016-10-31 DIAGNOSIS — Z96611 Presence of right artificial shoulder joint: Secondary | ICD-10-CM | POA: Diagnosis not present

## 2016-11-01 DIAGNOSIS — R269 Unspecified abnormalities of gait and mobility: Secondary | ICD-10-CM | POA: Diagnosis not present

## 2016-11-01 DIAGNOSIS — H409 Unspecified glaucoma: Secondary | ICD-10-CM | POA: Diagnosis not present

## 2016-11-01 DIAGNOSIS — I10 Essential (primary) hypertension: Secondary | ICD-10-CM | POA: Diagnosis not present

## 2016-11-01 DIAGNOSIS — W19XXXD Unspecified fall, subsequent encounter: Secondary | ICD-10-CM | POA: Diagnosis not present

## 2016-11-01 DIAGNOSIS — Z96611 Presence of right artificial shoulder joint: Secondary | ICD-10-CM | POA: Diagnosis not present

## 2016-11-01 DIAGNOSIS — S42301D Unspecified fracture of shaft of humerus, right arm, subsequent encounter for fracture with routine healing: Secondary | ICD-10-CM | POA: Diagnosis not present

## 2016-11-05 DIAGNOSIS — S42301D Unspecified fracture of shaft of humerus, right arm, subsequent encounter for fracture with routine healing: Secondary | ICD-10-CM | POA: Diagnosis not present

## 2016-11-05 DIAGNOSIS — R269 Unspecified abnormalities of gait and mobility: Secondary | ICD-10-CM | POA: Diagnosis not present

## 2016-11-05 DIAGNOSIS — H409 Unspecified glaucoma: Secondary | ICD-10-CM | POA: Diagnosis not present

## 2016-11-05 DIAGNOSIS — I10 Essential (primary) hypertension: Secondary | ICD-10-CM | POA: Diagnosis not present

## 2016-11-05 DIAGNOSIS — Z96611 Presence of right artificial shoulder joint: Secondary | ICD-10-CM | POA: Diagnosis not present

## 2016-11-05 DIAGNOSIS — W19XXXD Unspecified fall, subsequent encounter: Secondary | ICD-10-CM | POA: Diagnosis not present

## 2016-11-07 DIAGNOSIS — W19XXXD Unspecified fall, subsequent encounter: Secondary | ICD-10-CM | POA: Diagnosis not present

## 2016-11-07 DIAGNOSIS — H409 Unspecified glaucoma: Secondary | ICD-10-CM | POA: Diagnosis not present

## 2016-11-07 DIAGNOSIS — S42301D Unspecified fracture of shaft of humerus, right arm, subsequent encounter for fracture with routine healing: Secondary | ICD-10-CM | POA: Diagnosis not present

## 2016-11-07 DIAGNOSIS — R269 Unspecified abnormalities of gait and mobility: Secondary | ICD-10-CM | POA: Diagnosis not present

## 2016-11-07 DIAGNOSIS — Z96611 Presence of right artificial shoulder joint: Secondary | ICD-10-CM | POA: Diagnosis not present

## 2016-11-07 DIAGNOSIS — I10 Essential (primary) hypertension: Secondary | ICD-10-CM | POA: Diagnosis not present

## 2016-11-13 NOTE — Discharge Summary (Signed)
Patient ID: [name] MRN: [id number] DOB/AGE: Dec 07, 1935 81 y.o.  Admit date: 10/16/2016 Discharge date: 10/18/2016  Admission Diagnoses:  Active Problems:   S/P reverse total shoulder arthroplasty, right   Discharge Diagnoses:  Same  Past Medical History:  Diagnosis Date  . Gout   . High cholesterol   . Hypertension   . Hypothyroidism   . Pneumonia "?early 1970s"    Surgeries: Procedure(s): REVERSE SHOULDER ARTHROPLASTY on 10/16/2016   Consultants:   Discharged Condition: Improved  Hospital Course: MCKAILA DUFFUS is an 81 y.o. female who was admitted 10/16/2016 for operative treatment of comminuted right proximal humerus fracture. Patient had a fall resulting in a fracture requiring operative treatment to increased function ability and decrease likelihood of future pain. After pre-op clearance the patient was taken to the operating room on 10/16/2016 and underwent  Procedure(s): [place].    Patient was given perioperative antibiotics:  Anti-infectives    Start     Dose/Rate Route Frequency Ordered Stop   10/16/16 1700  ceFAZolin (ANCEF) IVPB 1 g/50 mL premix     1 g 100 mL/hr over 30 Minutes Intravenous Every 6 hours 10/16/16 1424 10/17/16 0551   10/16/16 0600  ceFAZolin (ANCEF) IVPB 2g/100 mL premix     2 g 200 mL/hr over 30 Minutes Intravenous On call to O.R. 10/16/16 0526 10/16/16 1124       Patient was given sequential compression devices, early ambulation, and ASA to prevent DVT.  Patient benefited maximally from hospital stay and there were no complications.    Recent vital signs: No data found.    Recent laboratory studies: No results for input(s): WBC, HGB, HCT, PLT, NA, K, CL, CO2, BUN, CREATININE, GLUCOSE, INR, CALCIUM in the last 72 hours.  Invalid input(s): PT, 2   Discharge Medications:   Allergies as of 10/18/2016      Reactions   Sulfa Antibiotics    UNSPECIFIED REACTION    Asa [aspirin] Other (See Comments)   Reaction:  GI  upset and headaches       Medication List    TAKE these medications   allopurinol 100 MG tablet Commonly known as:  ZYLOPRIM Take 200 mg by mouth daily.   amLODipine 5 MG tablet Commonly known as:  NORVASC Take 5 mg by mouth daily.   HYDROcodone-acetaminophen 5-325 MG tablet Commonly known as:  NORCO Take 1-2 tablets by mouth every 4 (four) hours as needed for severe pain. What changed:  how much to take  when to take this   labetalol 200 MG tablet Commonly known as:  NORMODYNE Take 100 mg by mouth 2 (two) times daily.   levobunolol 0.5 % ophthalmic solution Commonly known as:  BETAGAN Place 1 drop into both eyes daily.   levothyroxine 88 MCG tablet Commonly known as:  SYNTHROID, LEVOTHROID Take 88 mcg by mouth daily before breakfast.   metoCLOPramide 10 MG tablet Commonly known as:  REGLAN Take 1 tablet (10 mg total) by mouth every 6 (six) hours as needed for nausea (nausea/headache). 1 tablet every 6 hours as needed for nausea       Diagnostic Studies: Dg Shoulder Right Port  Result Date: 10/16/2016 CLINICAL DATA:  Reverse shoulder arthroplasty EXAM: PORTABLE RIGHT SHOULDER COMPARISON:  Right shoulder films of 10/10/2016 FINDINGS: A reverse shoulder arthroplasty has been performed. The components appear to be in good position with no complicating features noted. The right [facility] joint is normally aligned. IMPRESSION: Reverse shoulder arthroplasty components in good position.  No complicating features. Electronically Signed   By: Ivar Drape M.D.   On: 10/16/2016 14:13    Disposition: 01-Home or Self Care  Discharge Instructions    Call MD / Call 911    Complete by:  As directed    If you experience chest pain or shortness of breath, CALL 911 and be transported to the hospital emergency room.  If you develope a fever above 101 F, pus (white drainage) or increased drainage or redness at the wound, or calf pain, call your surgeon's office.   Constipation Prevention     Complete by:  As directed    Drink plenty of fluids.  Prune juice may be helpful.  You may use a stool softener, such as Colace (over the counter) 100 mg twice a day.  Use MiraLax (over the counter) for constipation as needed.   Diet - low sodium heart healthy    Complete by:  As directed    Discharge instructions    Complete by:  As directed    D/C after OT visit   Increase activity slowly as tolerated    Complete by:  As directed       Follow-up Information    Nita Sells, MD. Schedule an appointment as soon as possible for a visit in 2 week(s).   Specialty:  Orthopedic Surgery Contact information: [address] [place] [address] [phone]            Signed: Grier Mitts 11/13/2016, 2:50 PM

## 2016-11-28 DIAGNOSIS — S42291D Other displaced fracture of upper end of right humerus, subsequent encounter for fracture with routine healing: Secondary | ICD-10-CM | POA: Diagnosis not present

## 2016-12-04 DIAGNOSIS — M25621 Stiffness of right elbow, not elsewhere classified: Secondary | ICD-10-CM | POA: Diagnosis not present

## 2016-12-04 DIAGNOSIS — Z96611 Presence of right artificial shoulder joint: Secondary | ICD-10-CM | POA: Diagnosis not present

## 2016-12-04 DIAGNOSIS — M25611 Stiffness of right shoulder, not elsewhere classified: Secondary | ICD-10-CM | POA: Diagnosis not present

## 2016-12-09 DIAGNOSIS — I10 Essential (primary) hypertension: Secondary | ICD-10-CM | POA: Diagnosis not present

## 2016-12-09 DIAGNOSIS — E559 Vitamin D deficiency, unspecified: Secondary | ICD-10-CM | POA: Diagnosis not present

## 2016-12-09 DIAGNOSIS — N390 Urinary tract infection, site not specified: Secondary | ICD-10-CM | POA: Diagnosis not present

## 2016-12-09 DIAGNOSIS — E039 Hypothyroidism, unspecified: Secondary | ICD-10-CM | POA: Diagnosis not present

## 2016-12-11 DIAGNOSIS — Z96611 Presence of right artificial shoulder joint: Secondary | ICD-10-CM | POA: Diagnosis not present

## 2016-12-11 DIAGNOSIS — M25621 Stiffness of right elbow, not elsewhere classified: Secondary | ICD-10-CM | POA: Diagnosis not present

## 2016-12-11 DIAGNOSIS — M25611 Stiffness of right shoulder, not elsewhere classified: Secondary | ICD-10-CM | POA: Diagnosis not present

## 2016-12-12 DIAGNOSIS — I10 Essential (primary) hypertension: Secondary | ICD-10-CM | POA: Diagnosis not present

## 2016-12-12 DIAGNOSIS — M25611 Stiffness of right shoulder, not elsewhere classified: Secondary | ICD-10-CM | POA: Diagnosis not present

## 2016-12-12 DIAGNOSIS — B351 Tinea unguium: Secondary | ICD-10-CM | POA: Diagnosis not present

## 2016-12-12 DIAGNOSIS — M25621 Stiffness of right elbow, not elsewhere classified: Secondary | ICD-10-CM | POA: Diagnosis not present

## 2016-12-12 DIAGNOSIS — R5383 Other fatigue: Secondary | ICD-10-CM | POA: Diagnosis not present

## 2016-12-12 DIAGNOSIS — L309 Dermatitis, unspecified: Secondary | ICD-10-CM | POA: Diagnosis not present

## 2016-12-12 DIAGNOSIS — Z96611 Presence of right artificial shoulder joint: Secondary | ICD-10-CM | POA: Diagnosis not present

## 2016-12-15 DIAGNOSIS — M25611 Stiffness of right shoulder, not elsewhere classified: Secondary | ICD-10-CM | POA: Diagnosis not present

## 2016-12-15 DIAGNOSIS — Z96611 Presence of right artificial shoulder joint: Secondary | ICD-10-CM | POA: Diagnosis not present

## 2016-12-15 DIAGNOSIS — M25621 Stiffness of right elbow, not elsewhere classified: Secondary | ICD-10-CM | POA: Diagnosis not present

## 2016-12-18 DIAGNOSIS — Z96611 Presence of right artificial shoulder joint: Secondary | ICD-10-CM | POA: Diagnosis not present

## 2016-12-18 DIAGNOSIS — M25611 Stiffness of right shoulder, not elsewhere classified: Secondary | ICD-10-CM | POA: Diagnosis not present

## 2016-12-18 DIAGNOSIS — M25621 Stiffness of right elbow, not elsewhere classified: Secondary | ICD-10-CM | POA: Diagnosis not present

## 2016-12-24 DIAGNOSIS — M25611 Stiffness of right shoulder, not elsewhere classified: Secondary | ICD-10-CM | POA: Diagnosis not present

## 2016-12-24 DIAGNOSIS — M67912 Unspecified disorder of synovium and tendon, left shoulder: Secondary | ICD-10-CM | POA: Diagnosis not present

## 2016-12-24 DIAGNOSIS — Z96611 Presence of right artificial shoulder joint: Secondary | ICD-10-CM | POA: Diagnosis not present

## 2016-12-24 DIAGNOSIS — M25621 Stiffness of right elbow, not elsewhere classified: Secondary | ICD-10-CM | POA: Diagnosis not present

## 2016-12-29 DIAGNOSIS — M25611 Stiffness of right shoulder, not elsewhere classified: Secondary | ICD-10-CM | POA: Diagnosis not present

## 2016-12-29 DIAGNOSIS — M25621 Stiffness of right elbow, not elsewhere classified: Secondary | ICD-10-CM | POA: Diagnosis not present

## 2016-12-29 DIAGNOSIS — Z96611 Presence of right artificial shoulder joint: Secondary | ICD-10-CM | POA: Diagnosis not present

## 2017-01-01 DIAGNOSIS — M25621 Stiffness of right elbow, not elsewhere classified: Secondary | ICD-10-CM | POA: Diagnosis not present

## 2017-01-01 DIAGNOSIS — M25611 Stiffness of right shoulder, not elsewhere classified: Secondary | ICD-10-CM | POA: Diagnosis not present

## 2017-01-01 DIAGNOSIS — Z96611 Presence of right artificial shoulder joint: Secondary | ICD-10-CM | POA: Diagnosis not present

## 2017-01-05 DIAGNOSIS — M25621 Stiffness of right elbow, not elsewhere classified: Secondary | ICD-10-CM | POA: Diagnosis not present

## 2017-01-05 DIAGNOSIS — M25611 Stiffness of right shoulder, not elsewhere classified: Secondary | ICD-10-CM | POA: Diagnosis not present

## 2017-01-05 DIAGNOSIS — Z96611 Presence of right artificial shoulder joint: Secondary | ICD-10-CM | POA: Diagnosis not present

## 2017-01-14 DIAGNOSIS — M25611 Stiffness of right shoulder, not elsewhere classified: Secondary | ICD-10-CM | POA: Diagnosis not present

## 2017-01-14 DIAGNOSIS — M25621 Stiffness of right elbow, not elsewhere classified: Secondary | ICD-10-CM | POA: Diagnosis not present

## 2017-01-14 DIAGNOSIS — Z96611 Presence of right artificial shoulder joint: Secondary | ICD-10-CM | POA: Diagnosis not present

## 2017-01-19 DIAGNOSIS — M25611 Stiffness of right shoulder, not elsewhere classified: Secondary | ICD-10-CM | POA: Diagnosis not present

## 2017-01-19 DIAGNOSIS — M25621 Stiffness of right elbow, not elsewhere classified: Secondary | ICD-10-CM | POA: Diagnosis not present

## 2017-01-19 DIAGNOSIS — Z96611 Presence of right artificial shoulder joint: Secondary | ICD-10-CM | POA: Diagnosis not present

## 2017-01-21 DIAGNOSIS — M25511 Pain in right shoulder: Secondary | ICD-10-CM | POA: Diagnosis not present

## 2017-01-21 DIAGNOSIS — Z96611 Presence of right artificial shoulder joint: Secondary | ICD-10-CM | POA: Diagnosis not present

## 2017-01-21 DIAGNOSIS — M25621 Stiffness of right elbow, not elsewhere classified: Secondary | ICD-10-CM | POA: Diagnosis not present

## 2017-01-21 DIAGNOSIS — Z09 Encounter for follow-up examination after completed treatment for conditions other than malignant neoplasm: Secondary | ICD-10-CM | POA: Diagnosis not present

## 2017-01-21 DIAGNOSIS — M67911 Unspecified disorder of synovium and tendon, right shoulder: Secondary | ICD-10-CM | POA: Diagnosis not present

## 2017-01-21 DIAGNOSIS — M67912 Unspecified disorder of synovium and tendon, left shoulder: Secondary | ICD-10-CM | POA: Diagnosis not present

## 2017-01-28 DIAGNOSIS — M25621 Stiffness of right elbow, not elsewhere classified: Secondary | ICD-10-CM | POA: Diagnosis not present

## 2017-01-28 DIAGNOSIS — M25611 Stiffness of right shoulder, not elsewhere classified: Secondary | ICD-10-CM | POA: Diagnosis not present

## 2017-01-28 DIAGNOSIS — Z96611 Presence of right artificial shoulder joint: Secondary | ICD-10-CM | POA: Diagnosis not present

## 2017-02-02 DIAGNOSIS — J329 Chronic sinusitis, unspecified: Secondary | ICD-10-CM | POA: Diagnosis not present

## 2017-02-04 DIAGNOSIS — M25621 Stiffness of right elbow, not elsewhere classified: Secondary | ICD-10-CM | POA: Diagnosis not present

## 2017-02-04 DIAGNOSIS — M25611 Stiffness of right shoulder, not elsewhere classified: Secondary | ICD-10-CM | POA: Diagnosis not present

## 2017-02-04 DIAGNOSIS — Z96611 Presence of right artificial shoulder joint: Secondary | ICD-10-CM | POA: Diagnosis not present

## 2017-02-06 DIAGNOSIS — M25621 Stiffness of right elbow, not elsewhere classified: Secondary | ICD-10-CM | POA: Diagnosis not present

## 2017-02-06 DIAGNOSIS — M25611 Stiffness of right shoulder, not elsewhere classified: Secondary | ICD-10-CM | POA: Diagnosis not present

## 2017-02-06 DIAGNOSIS — Z96611 Presence of right artificial shoulder joint: Secondary | ICD-10-CM | POA: Diagnosis not present

## 2017-02-09 DIAGNOSIS — M25621 Stiffness of right elbow, not elsewhere classified: Secondary | ICD-10-CM | POA: Diagnosis not present

## 2017-02-09 DIAGNOSIS — M25611 Stiffness of right shoulder, not elsewhere classified: Secondary | ICD-10-CM | POA: Diagnosis not present

## 2017-02-09 DIAGNOSIS — Z96611 Presence of right artificial shoulder joint: Secondary | ICD-10-CM | POA: Diagnosis not present

## 2017-02-20 DIAGNOSIS — M25621 Stiffness of right elbow, not elsewhere classified: Secondary | ICD-10-CM | POA: Diagnosis not present

## 2017-02-20 DIAGNOSIS — Z96611 Presence of right artificial shoulder joint: Secondary | ICD-10-CM | POA: Diagnosis not present

## 2017-02-20 DIAGNOSIS — M25611 Stiffness of right shoulder, not elsewhere classified: Secondary | ICD-10-CM | POA: Diagnosis not present

## 2017-02-23 DIAGNOSIS — H524 Presbyopia: Secondary | ICD-10-CM | POA: Diagnosis not present

## 2017-02-23 DIAGNOSIS — H353132 Nonexudative age-related macular degeneration, bilateral, intermediate dry stage: Secondary | ICD-10-CM | POA: Diagnosis not present

## 2017-02-23 DIAGNOSIS — H52223 Regular astigmatism, bilateral: Secondary | ICD-10-CM | POA: Diagnosis not present

## 2017-02-23 DIAGNOSIS — H401134 Primary open-angle glaucoma, bilateral, indeterminate stage: Secondary | ICD-10-CM | POA: Diagnosis not present

## 2017-02-23 DIAGNOSIS — H5213 Myopia, bilateral: Secondary | ICD-10-CM | POA: Diagnosis not present

## 2017-02-23 DIAGNOSIS — H5712 Ocular pain, left eye: Secondary | ICD-10-CM | POA: Diagnosis not present

## 2017-02-25 DIAGNOSIS — M1711 Unilateral primary osteoarthritis, right knee: Secondary | ICD-10-CM | POA: Diagnosis not present

## 2017-02-25 DIAGNOSIS — M67912 Unspecified disorder of synovium and tendon, left shoulder: Secondary | ICD-10-CM | POA: Diagnosis not present

## 2017-03-02 DIAGNOSIS — M25621 Stiffness of right elbow, not elsewhere classified: Secondary | ICD-10-CM | POA: Diagnosis not present

## 2017-03-02 DIAGNOSIS — Z96611 Presence of right artificial shoulder joint: Secondary | ICD-10-CM | POA: Diagnosis not present

## 2017-03-02 DIAGNOSIS — M25611 Stiffness of right shoulder, not elsewhere classified: Secondary | ICD-10-CM | POA: Diagnosis not present

## 2017-03-04 DIAGNOSIS — Z96611 Presence of right artificial shoulder joint: Secondary | ICD-10-CM | POA: Diagnosis not present

## 2017-03-04 DIAGNOSIS — M25611 Stiffness of right shoulder, not elsewhere classified: Secondary | ICD-10-CM | POA: Diagnosis not present

## 2017-03-04 DIAGNOSIS — M25621 Stiffness of right elbow, not elsewhere classified: Secondary | ICD-10-CM | POA: Diagnosis not present

## 2017-03-11 DIAGNOSIS — M25621 Stiffness of right elbow, not elsewhere classified: Secondary | ICD-10-CM | POA: Diagnosis not present

## 2017-03-11 DIAGNOSIS — Z96611 Presence of right artificial shoulder joint: Secondary | ICD-10-CM | POA: Diagnosis not present

## 2017-03-11 DIAGNOSIS — M25611 Stiffness of right shoulder, not elsewhere classified: Secondary | ICD-10-CM | POA: Diagnosis not present

## 2017-03-16 NOTE — Addendum Note (Signed)
Addendum  created 03/16/17 0949 by Oleta Mouse, MD   Sign clinical note

## 2017-03-18 DIAGNOSIS — M2550 Pain in unspecified joint: Secondary | ICD-10-CM | POA: Diagnosis not present

## 2017-03-18 DIAGNOSIS — I10 Essential (primary) hypertension: Secondary | ICD-10-CM | POA: Diagnosis not present

## 2017-04-13 DIAGNOSIS — N390 Urinary tract infection, site not specified: Secondary | ICD-10-CM | POA: Diagnosis not present

## 2017-04-13 DIAGNOSIS — R399 Unspecified symptoms and signs involving the genitourinary system: Secondary | ICD-10-CM | POA: Diagnosis not present

## 2017-04-29 DIAGNOSIS — M25561 Pain in right knee: Secondary | ICD-10-CM | POA: Diagnosis not present

## 2017-04-29 DIAGNOSIS — M150 Primary generalized (osteo)arthritis: Secondary | ICD-10-CM | POA: Diagnosis not present

## 2017-04-29 DIAGNOSIS — M19041 Primary osteoarthritis, right hand: Secondary | ICD-10-CM | POA: Diagnosis not present

## 2017-04-29 DIAGNOSIS — M1A09X0 Idiopathic chronic gout, multiple sites, without tophus (tophi): Secondary | ICD-10-CM | POA: Diagnosis not present

## 2017-04-29 DIAGNOSIS — M79643 Pain in unspecified hand: Secondary | ICD-10-CM | POA: Diagnosis not present

## 2017-04-29 DIAGNOSIS — M19042 Primary osteoarthritis, left hand: Secondary | ICD-10-CM | POA: Diagnosis not present

## 2017-06-17 DIAGNOSIS — Z96611 Presence of right artificial shoulder joint: Secondary | ICD-10-CM | POA: Diagnosis not present

## 2017-06-17 DIAGNOSIS — M67911 Unspecified disorder of synovium and tendon, right shoulder: Secondary | ICD-10-CM | POA: Diagnosis not present

## 2017-06-17 DIAGNOSIS — M67912 Unspecified disorder of synovium and tendon, left shoulder: Secondary | ICD-10-CM | POA: Diagnosis not present

## 2017-06-17 DIAGNOSIS — M1711 Unilateral primary osteoarthritis, right knee: Secondary | ICD-10-CM | POA: Diagnosis not present

## 2017-06-17 DIAGNOSIS — Z471 Aftercare following joint replacement surgery: Secondary | ICD-10-CM | POA: Diagnosis not present

## 2017-06-21 DIAGNOSIS — Z23 Encounter for immunization: Secondary | ICD-10-CM | POA: Diagnosis not present

## 2017-07-21 DIAGNOSIS — I10 Essential (primary) hypertension: Secondary | ICD-10-CM | POA: Diagnosis not present

## 2017-07-21 DIAGNOSIS — R5383 Other fatigue: Secondary | ICD-10-CM | POA: Diagnosis not present

## 2017-07-21 DIAGNOSIS — R5381 Other malaise: Secondary | ICD-10-CM | POA: Diagnosis not present

## 2017-07-21 DIAGNOSIS — R739 Hyperglycemia, unspecified: Secondary | ICD-10-CM | POA: Diagnosis not present

## 2017-07-21 DIAGNOSIS — F329 Major depressive disorder, single episode, unspecified: Secondary | ICD-10-CM | POA: Diagnosis not present

## 2017-07-27 DIAGNOSIS — R5383 Other fatigue: Secondary | ICD-10-CM | POA: Diagnosis not present

## 2017-07-27 DIAGNOSIS — E039 Hypothyroidism, unspecified: Secondary | ICD-10-CM | POA: Diagnosis not present

## 2017-07-27 DIAGNOSIS — F329 Major depressive disorder, single episode, unspecified: Secondary | ICD-10-CM | POA: Diagnosis not present

## 2017-07-27 DIAGNOSIS — E559 Vitamin D deficiency, unspecified: Secondary | ICD-10-CM | POA: Diagnosis not present

## 2017-08-10 DIAGNOSIS — R109 Unspecified abdominal pain: Secondary | ICD-10-CM | POA: Diagnosis not present

## 2017-08-21 DIAGNOSIS — R5383 Other fatigue: Secondary | ICD-10-CM | POA: Diagnosis not present

## 2017-11-18 DIAGNOSIS — Z471 Aftercare following joint replacement surgery: Secondary | ICD-10-CM | POA: Diagnosis not present

## 2017-11-18 DIAGNOSIS — Z96611 Presence of right artificial shoulder joint: Secondary | ICD-10-CM | POA: Diagnosis not present

## 2017-11-18 DIAGNOSIS — M25511 Pain in right shoulder: Secondary | ICD-10-CM | POA: Diagnosis not present

## 2017-12-09 DIAGNOSIS — E559 Vitamin D deficiency, unspecified: Secondary | ICD-10-CM | POA: Diagnosis not present

## 2017-12-09 DIAGNOSIS — R52 Pain, unspecified: Secondary | ICD-10-CM | POA: Diagnosis not present

## 2017-12-09 DIAGNOSIS — R51 Headache: Secondary | ICD-10-CM | POA: Diagnosis not present

## 2017-12-09 DIAGNOSIS — F329 Major depressive disorder, single episode, unspecified: Secondary | ICD-10-CM | POA: Diagnosis not present

## 2017-12-09 DIAGNOSIS — I10 Essential (primary) hypertension: Secondary | ICD-10-CM | POA: Diagnosis not present

## 2017-12-09 DIAGNOSIS — R21 Rash and other nonspecific skin eruption: Secondary | ICD-10-CM | POA: Diagnosis not present

## 2017-12-09 DIAGNOSIS — L853 Xerosis cutis: Secondary | ICD-10-CM | POA: Diagnosis not present

## 2017-12-09 DIAGNOSIS — L309 Dermatitis, unspecified: Secondary | ICD-10-CM | POA: Diagnosis not present

## 2017-12-14 DIAGNOSIS — R52 Pain, unspecified: Secondary | ICD-10-CM | POA: Diagnosis not present

## 2017-12-14 DIAGNOSIS — I10 Essential (primary) hypertension: Secondary | ICD-10-CM | POA: Diagnosis not present

## 2017-12-18 DIAGNOSIS — M109 Gout, unspecified: Secondary | ICD-10-CM | POA: Diagnosis not present

## 2017-12-18 DIAGNOSIS — K219 Gastro-esophageal reflux disease without esophagitis: Secondary | ICD-10-CM | POA: Diagnosis not present

## 2017-12-18 DIAGNOSIS — R011 Cardiac murmur, unspecified: Secondary | ICD-10-CM | POA: Diagnosis not present

## 2017-12-18 DIAGNOSIS — I10 Essential (primary) hypertension: Secondary | ICD-10-CM | POA: Diagnosis not present

## 2017-12-18 DIAGNOSIS — H9193 Unspecified hearing loss, bilateral: Secondary | ICD-10-CM | POA: Diagnosis not present

## 2017-12-18 DIAGNOSIS — M1A00X0 Idiopathic chronic gout, unspecified site, without tophus (tophi): Secondary | ICD-10-CM | POA: Diagnosis not present

## 2017-12-18 DIAGNOSIS — R911 Solitary pulmonary nodule: Secondary | ICD-10-CM | POA: Diagnosis not present

## 2017-12-18 DIAGNOSIS — R5383 Other fatigue: Secondary | ICD-10-CM | POA: Diagnosis not present

## 2017-12-18 DIAGNOSIS — E039 Hypothyroidism, unspecified: Secondary | ICD-10-CM | POA: Diagnosis not present

## 2017-12-18 DIAGNOSIS — R739 Hyperglycemia, unspecified: Secondary | ICD-10-CM | POA: Diagnosis not present

## 2017-12-18 DIAGNOSIS — M159 Polyosteoarthritis, unspecified: Secondary | ICD-10-CM | POA: Diagnosis not present

## 2017-12-18 DIAGNOSIS — Z0001 Encounter for general adult medical examination with abnormal findings: Secondary | ICD-10-CM | POA: Diagnosis not present

## 2017-12-24 DIAGNOSIS — H353132 Nonexudative age-related macular degeneration, bilateral, intermediate dry stage: Secondary | ICD-10-CM | POA: Diagnosis not present

## 2017-12-24 DIAGNOSIS — H5213 Myopia, bilateral: Secondary | ICD-10-CM | POA: Diagnosis not present

## 2017-12-24 DIAGNOSIS — H524 Presbyopia: Secondary | ICD-10-CM | POA: Diagnosis not present

## 2017-12-24 DIAGNOSIS — H401132 Primary open-angle glaucoma, bilateral, moderate stage: Secondary | ICD-10-CM | POA: Diagnosis not present

## 2017-12-24 DIAGNOSIS — H52223 Regular astigmatism, bilateral: Secondary | ICD-10-CM | POA: Diagnosis not present

## 2017-12-24 DIAGNOSIS — Z961 Presence of intraocular lens: Secondary | ICD-10-CM | POA: Diagnosis not present

## 2017-12-31 DIAGNOSIS — F329 Major depressive disorder, single episode, unspecified: Secondary | ICD-10-CM | POA: Diagnosis not present

## 2018-02-02 DIAGNOSIS — S61011A Laceration without foreign body of right thumb without damage to nail, initial encounter: Secondary | ICD-10-CM | POA: Diagnosis not present

## 2018-02-03 DIAGNOSIS — M2550 Pain in unspecified joint: Secondary | ICD-10-CM | POA: Diagnosis not present

## 2018-02-03 DIAGNOSIS — R5383 Other fatigue: Secondary | ICD-10-CM | POA: Diagnosis not present

## 2018-02-12 DIAGNOSIS — S61011D Laceration without foreign body of right thumb without damage to nail, subsequent encounter: Secondary | ICD-10-CM | POA: Diagnosis not present

## 2018-03-04 DIAGNOSIS — F329 Major depressive disorder, single episode, unspecified: Secondary | ICD-10-CM | POA: Diagnosis not present

## 2018-03-04 DIAGNOSIS — R0989 Other specified symptoms and signs involving the circulatory and respiratory systems: Secondary | ICD-10-CM | POA: Diagnosis not present

## 2018-03-04 DIAGNOSIS — M791 Myalgia, unspecified site: Secondary | ICD-10-CM | POA: Diagnosis not present

## 2018-03-04 DIAGNOSIS — E039 Hypothyroidism, unspecified: Secondary | ICD-10-CM | POA: Diagnosis not present

## 2018-03-04 DIAGNOSIS — G629 Polyneuropathy, unspecified: Secondary | ICD-10-CM | POA: Diagnosis not present

## 2018-03-04 DIAGNOSIS — R5381 Other malaise: Secondary | ICD-10-CM | POA: Diagnosis not present

## 2018-03-11 DIAGNOSIS — I739 Peripheral vascular disease, unspecified: Secondary | ICD-10-CM | POA: Diagnosis not present

## 2018-03-11 DIAGNOSIS — R0989 Other specified symptoms and signs involving the circulatory and respiratory systems: Secondary | ICD-10-CM | POA: Diagnosis not present

## 2018-03-23 DIAGNOSIS — M6281 Muscle weakness (generalized): Secondary | ICD-10-CM | POA: Diagnosis not present

## 2018-03-25 DIAGNOSIS — R5383 Other fatigue: Secondary | ICD-10-CM | POA: Diagnosis not present

## 2018-03-25 DIAGNOSIS — M797 Fibromyalgia: Secondary | ICD-10-CM | POA: Diagnosis not present

## 2018-03-25 DIAGNOSIS — M150 Primary generalized (osteo)arthritis: Secondary | ICD-10-CM | POA: Diagnosis not present

## 2018-03-25 DIAGNOSIS — M25519 Pain in unspecified shoulder: Secondary | ICD-10-CM | POA: Diagnosis not present

## 2018-03-25 DIAGNOSIS — M791 Myalgia, unspecified site: Secondary | ICD-10-CM | POA: Diagnosis not present

## 2018-03-25 DIAGNOSIS — M1A09X0 Idiopathic chronic gout, multiple sites, without tophus (tophi): Secondary | ICD-10-CM | POA: Diagnosis not present

## 2018-03-25 DIAGNOSIS — M25561 Pain in right knee: Secondary | ICD-10-CM | POA: Diagnosis not present

## 2018-04-22 DIAGNOSIS — F329 Major depressive disorder, single episode, unspecified: Secondary | ICD-10-CM | POA: Diagnosis not present

## 2018-04-22 DIAGNOSIS — M797 Fibromyalgia: Secondary | ICD-10-CM | POA: Diagnosis not present

## 2018-04-22 DIAGNOSIS — R5381 Other malaise: Secondary | ICD-10-CM | POA: Diagnosis not present

## 2018-04-27 DIAGNOSIS — M6281 Muscle weakness (generalized): Secondary | ICD-10-CM | POA: Diagnosis not present

## 2018-05-03 DIAGNOSIS — M6281 Muscle weakness (generalized): Secondary | ICD-10-CM | POA: Diagnosis not present

## 2018-05-11 DIAGNOSIS — M6281 Muscle weakness (generalized): Secondary | ICD-10-CM | POA: Diagnosis not present

## 2018-05-20 DIAGNOSIS — M6281 Muscle weakness (generalized): Secondary | ICD-10-CM | POA: Diagnosis not present

## 2018-05-23 IMAGING — CR DG RIBS W/ CHEST 3+V*R*
3 series · 3 of 3 positions shown · non-contrast
Comparison: Chest x-ray 12/13/2015 heart is mildly enlarged. There
is

CLINICAL DATA: Fall today, pt states that she was walking and
tripped and fell; most pain in right upper arm; SOB due to pain; no
known cardiopulmonary problems; HTN; non smoker; pt was not able to
obtain better AP right humerus due to pt condition, best obtainable

EXAM:
RIGHT RIBS AND CHEST - 3+ VIEW

[t ribs ap upper right]
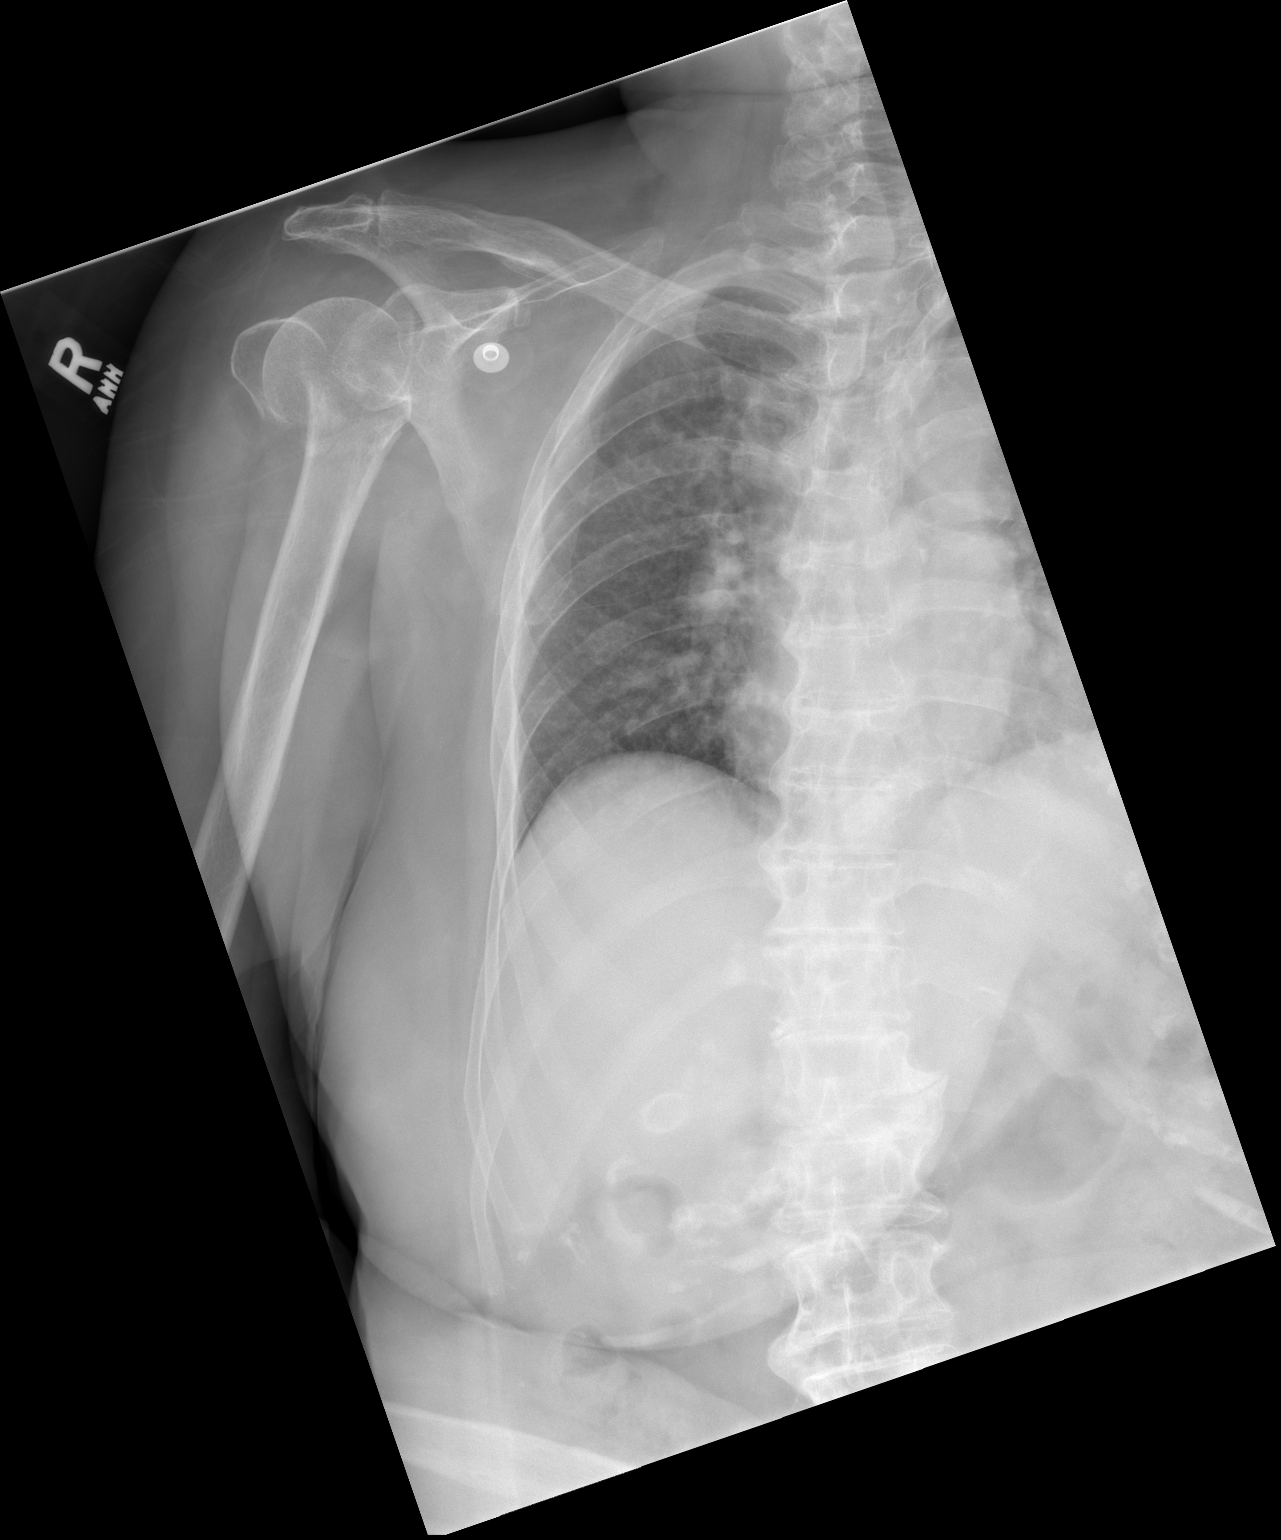

[t ribs rpo right]
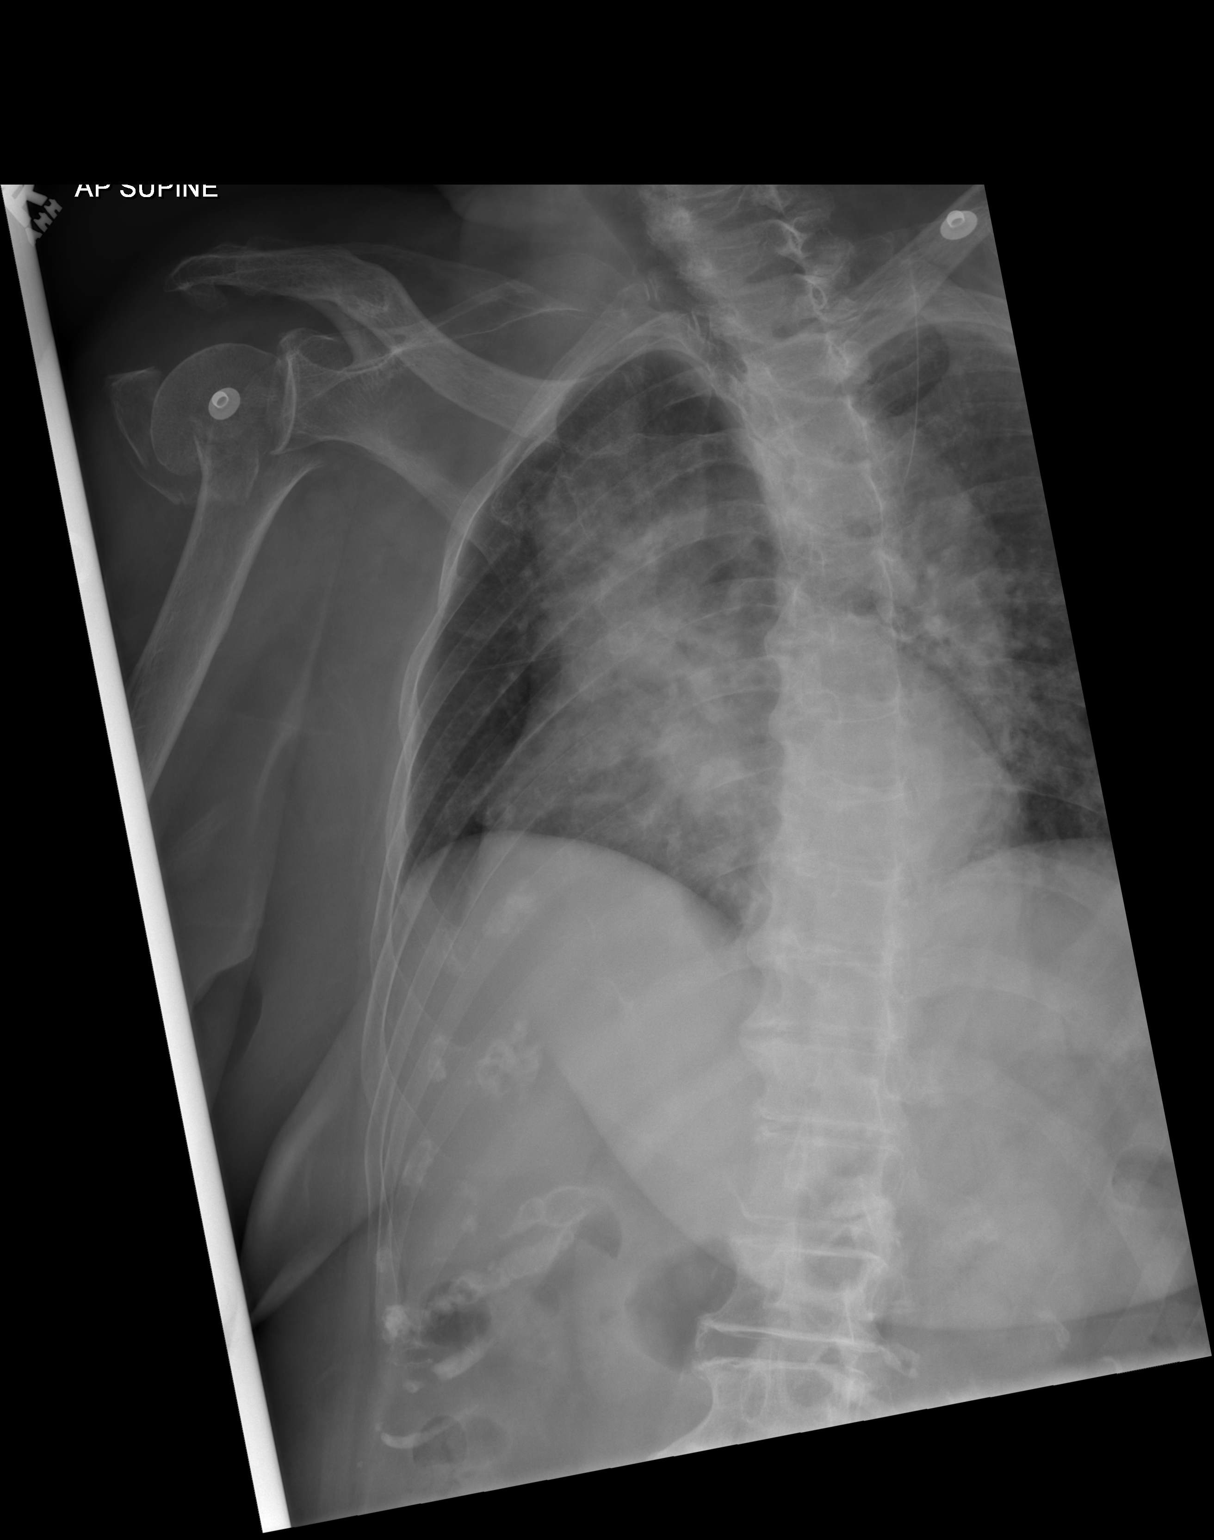

[x chest ap]
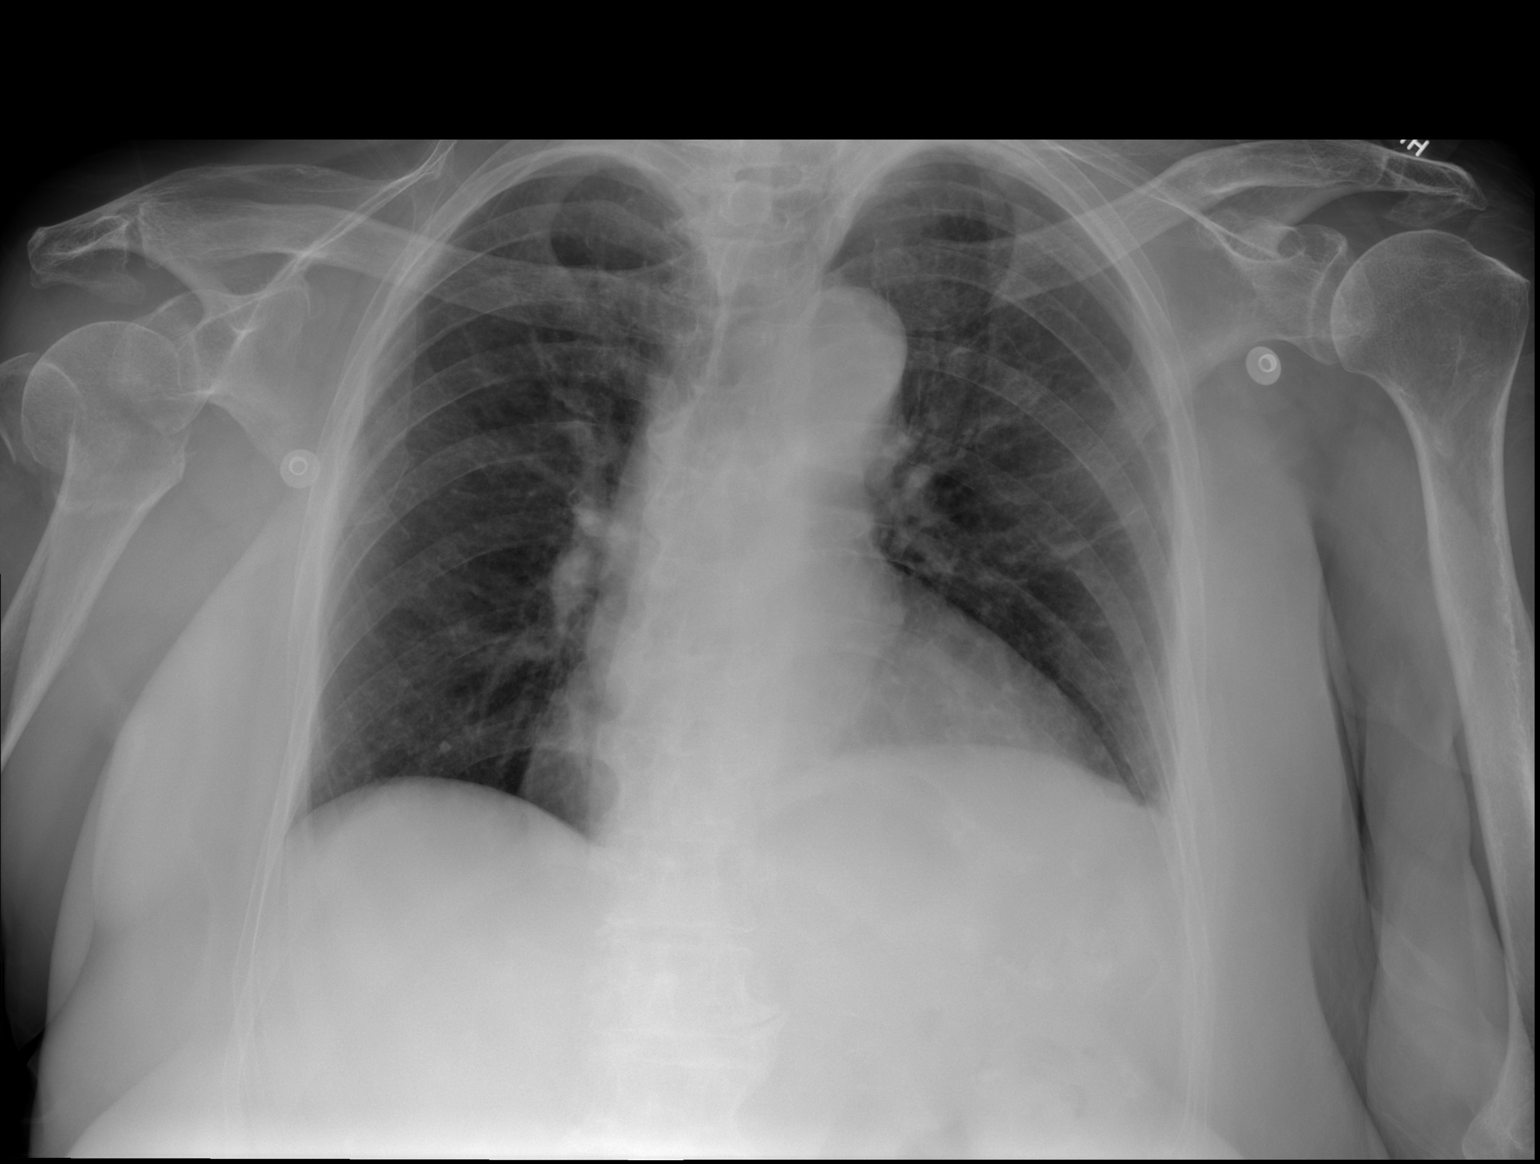

[3 of 3 positions shown; findings below may reference images not displayed]

FINDINGS: No focal consolidation or pleural effusion. No pulmonary edema. No
pneumothorax. Minimal scarring in the left mid lung zone appear
stable.

Comminuted fracture of the right humeral head. Oblique views
demonstrate no acute rib fracture. Note is made of calcified
gallstone.
IMPRESSION: 1. Comminuted fracture of the right humeral head.
2. Cholelithiasis.

## 2018-05-25 DIAGNOSIS — M6281 Muscle weakness (generalized): Secondary | ICD-10-CM | POA: Diagnosis not present

## 2018-05-29 IMAGING — CR DG SHOULDER 2+V PORT*R*
2 series · 2 of 2 positions shown · non-contrast
Comparison: Right shoulder films of 10/10/2016

CLINICAL DATA: Reverse shoulder arthroplasty

EXAM:
PORTABLE RIGHT SHOULDER

[ap (1 of 2)]
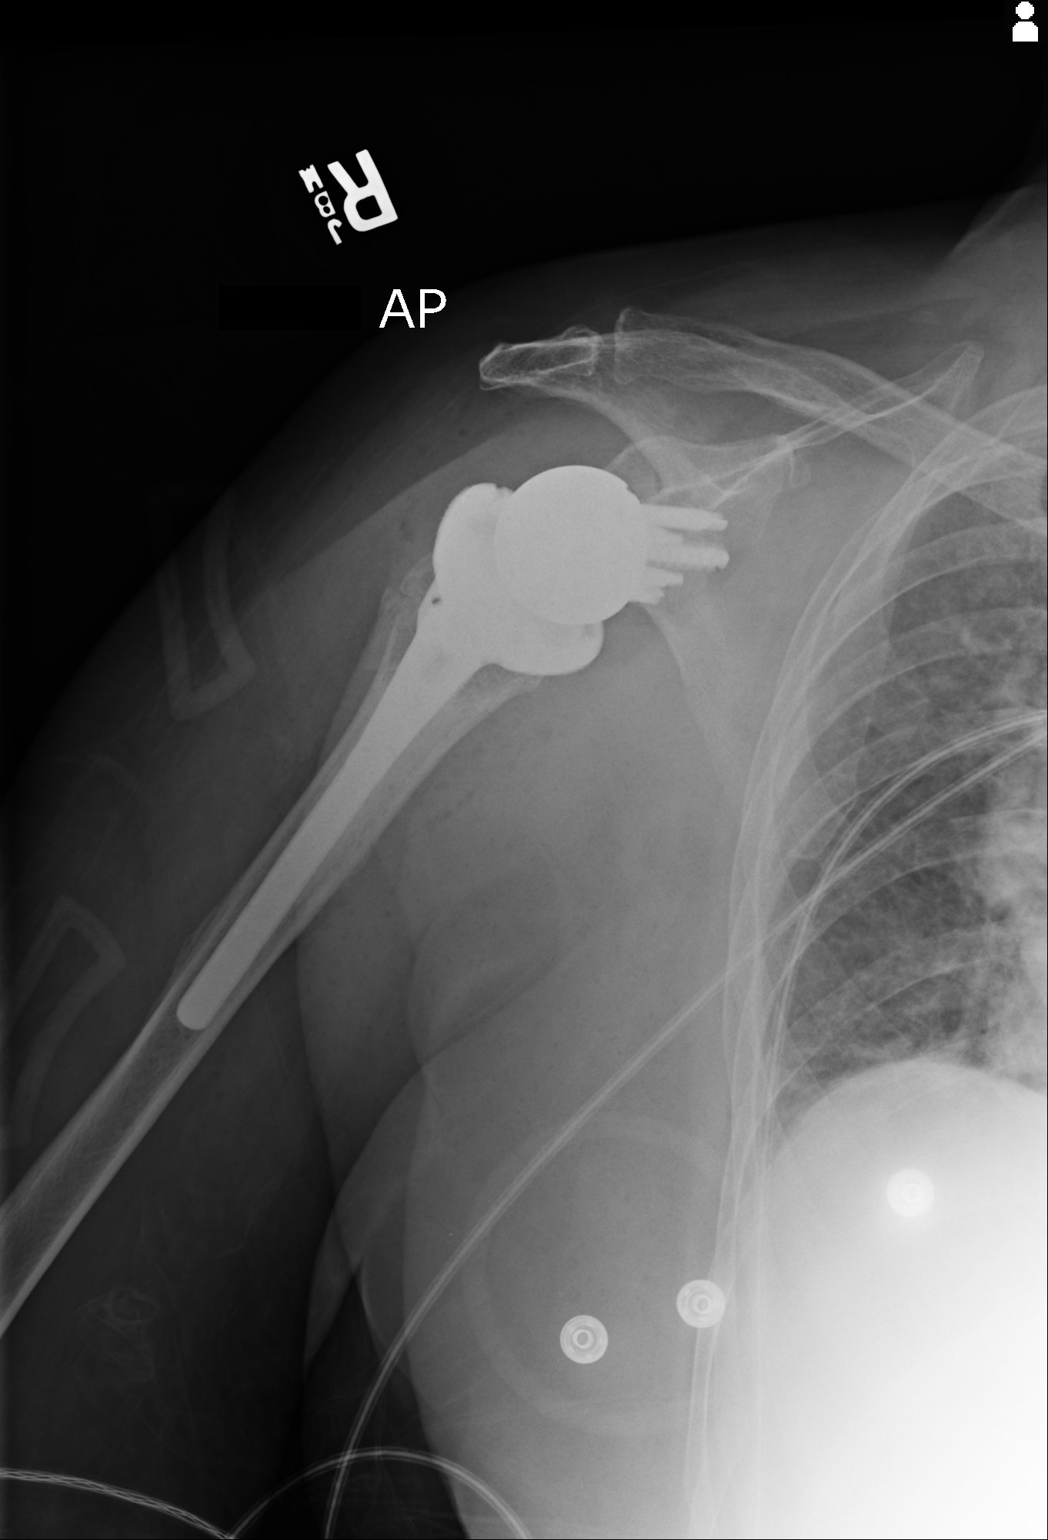

[ap (2 of 2)]
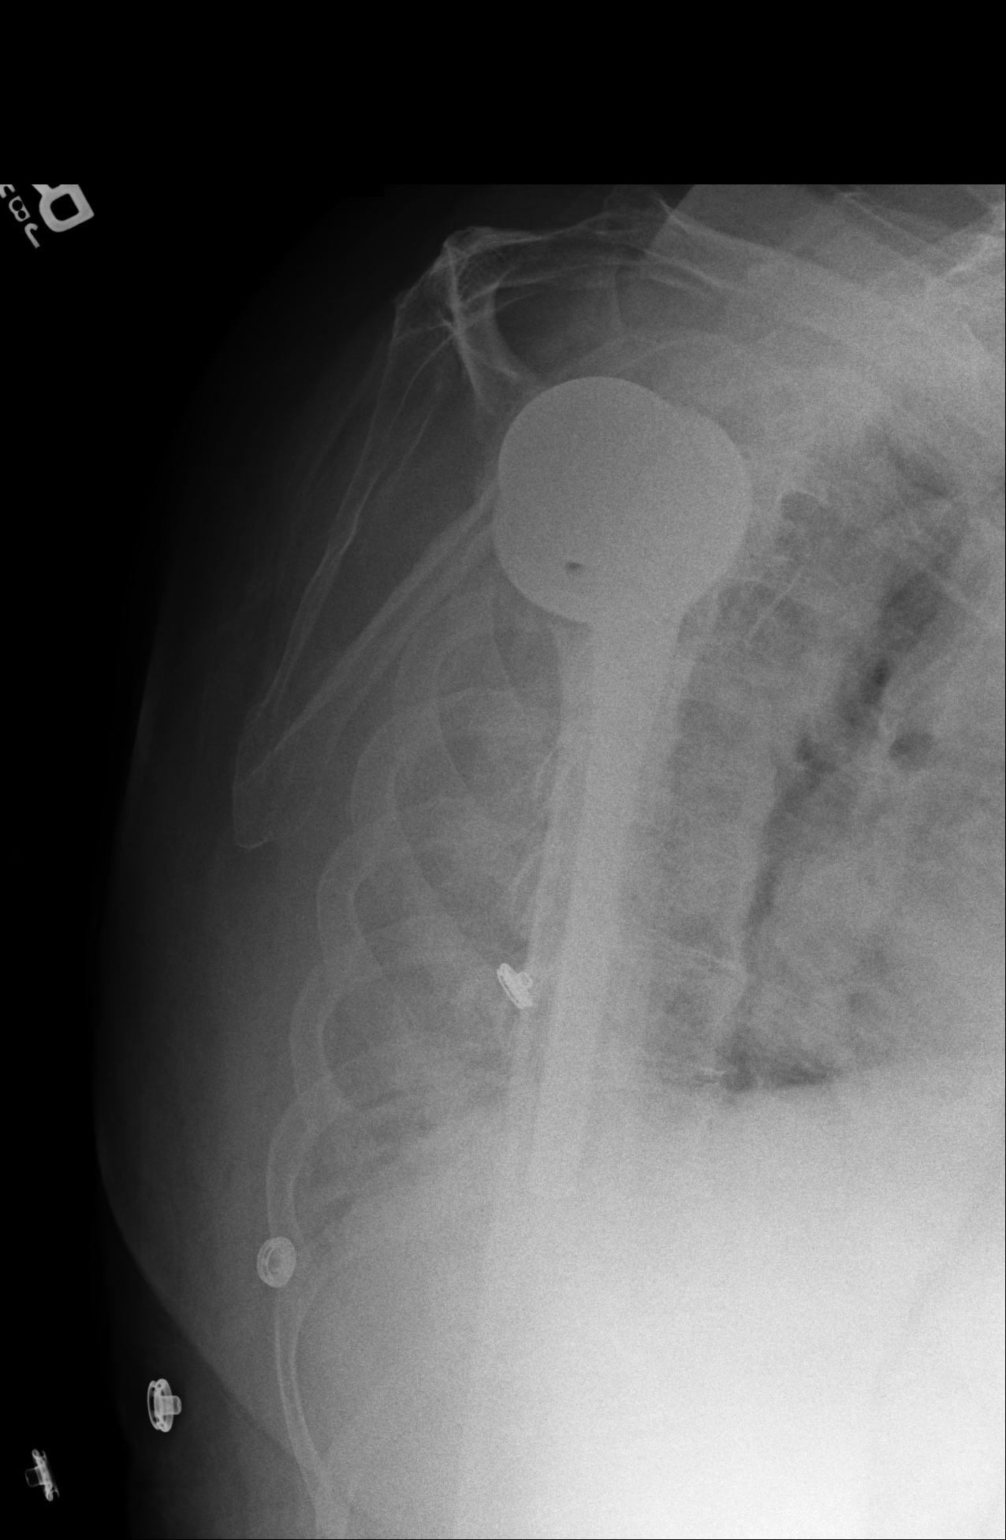

[2 of 2 positions shown; findings below may reference images not displayed]

FINDINGS: A reverse shoulder arthroplasty has been performed. The components
appear to be in good position with no complicating features noted.
The right AC joint is normally aligned.
IMPRESSION: Reverse shoulder arthroplasty components in good position. No
complicating features.

## 2018-06-01 DIAGNOSIS — M6281 Muscle weakness (generalized): Secondary | ICD-10-CM | POA: Diagnosis not present

## 2018-06-07 DIAGNOSIS — M6281 Muscle weakness (generalized): Secondary | ICD-10-CM | POA: Diagnosis not present

## 2018-06-16 DIAGNOSIS — Z23 Encounter for immunization: Secondary | ICD-10-CM | POA: Diagnosis not present

## 2018-06-28 DIAGNOSIS — M159 Polyosteoarthritis, unspecified: Secondary | ICD-10-CM | POA: Diagnosis not present

## 2018-06-28 DIAGNOSIS — M2550 Pain in unspecified joint: Secondary | ICD-10-CM | POA: Diagnosis not present

## 2018-06-28 DIAGNOSIS — M12812 Other specific arthropathies, not elsewhere classified, left shoulder: Secondary | ICD-10-CM | POA: Diagnosis not present

## 2018-06-28 DIAGNOSIS — I10 Essential (primary) hypertension: Secondary | ICD-10-CM | POA: Diagnosis not present

## 2018-08-31 DIAGNOSIS — M67919 Unspecified disorder of synovium and tendon, unspecified shoulder: Secondary | ICD-10-CM | POA: Diagnosis not present

## 2018-08-31 DIAGNOSIS — I10 Essential (primary) hypertension: Secondary | ICD-10-CM | POA: Diagnosis not present

## 2019-04-04 DIAGNOSIS — I10 Essential (primary) hypertension: Secondary | ICD-10-CM | POA: Diagnosis not present

## 2019-04-04 DIAGNOSIS — N390 Urinary tract infection, site not specified: Secondary | ICD-10-CM | POA: Diagnosis not present

## 2019-04-13 DIAGNOSIS — I499 Cardiac arrhythmia, unspecified: Secondary | ICD-10-CM | POA: Diagnosis not present

## 2019-04-13 DIAGNOSIS — K921 Melena: Secondary | ICD-10-CM | POA: Diagnosis not present

## 2019-04-13 DIAGNOSIS — R1030 Lower abdominal pain, unspecified: Secondary | ICD-10-CM | POA: Diagnosis not present

## 2019-04-19 DIAGNOSIS — Z1212 Encounter for screening for malignant neoplasm of rectum: Secondary | ICD-10-CM | POA: Diagnosis not present

## 2019-04-21 DIAGNOSIS — R4589 Other symptoms and signs involving emotional state: Secondary | ICD-10-CM | POA: Diagnosis not present

## 2019-04-21 DIAGNOSIS — N3000 Acute cystitis without hematuria: Secondary | ICD-10-CM | POA: Diagnosis not present

## 2019-04-21 DIAGNOSIS — L821 Other seborrheic keratosis: Secondary | ICD-10-CM | POA: Diagnosis not present

## 2019-05-12 DIAGNOSIS — Z01810 Encounter for preprocedural cardiovascular examination: Secondary | ICD-10-CM | POA: Diagnosis not present

## 2019-05-12 DIAGNOSIS — M1990 Unspecified osteoarthritis, unspecified site: Secondary | ICD-10-CM | POA: Diagnosis not present

## 2019-05-12 DIAGNOSIS — M109 Gout, unspecified: Secondary | ICD-10-CM | POA: Diagnosis not present

## 2019-05-12 DIAGNOSIS — E039 Hypothyroidism, unspecified: Secondary | ICD-10-CM | POA: Diagnosis not present

## 2019-05-12 DIAGNOSIS — R1013 Epigastric pain: Secondary | ICD-10-CM | POA: Diagnosis not present

## 2019-05-12 DIAGNOSIS — Z1211 Encounter for screening for malignant neoplasm of colon: Secondary | ICD-10-CM | POA: Diagnosis not present

## 2019-05-12 DIAGNOSIS — K219 Gastro-esophageal reflux disease without esophagitis: Secondary | ICD-10-CM | POA: Diagnosis not present

## 2019-05-12 DIAGNOSIS — I358 Other nonrheumatic aortic valve disorders: Secondary | ICD-10-CM | POA: Diagnosis not present

## 2019-05-12 DIAGNOSIS — I10 Essential (primary) hypertension: Secondary | ICD-10-CM | POA: Diagnosis not present

## 2019-05-12 DIAGNOSIS — R109 Unspecified abdominal pain: Secondary | ICD-10-CM | POA: Diagnosis not present

## 2019-05-20 DIAGNOSIS — E039 Hypothyroidism, unspecified: Secondary | ICD-10-CM | POA: Diagnosis not present

## 2019-05-20 DIAGNOSIS — E871 Hypo-osmolality and hyponatremia: Secondary | ICD-10-CM | POA: Diagnosis not present

## 2019-05-21 DIAGNOSIS — Z1211 Encounter for screening for malignant neoplasm of colon: Secondary | ICD-10-CM | POA: Diagnosis not present

## 2019-05-21 DIAGNOSIS — Z1212 Encounter for screening for malignant neoplasm of rectum: Secondary | ICD-10-CM | POA: Diagnosis not present

## 2019-05-23 DIAGNOSIS — Z23 Encounter for immunization: Secondary | ICD-10-CM | POA: Diagnosis not present

## 2019-08-10 DIAGNOSIS — M109 Gout, unspecified: Secondary | ICD-10-CM | POA: Diagnosis not present

## 2019-08-10 DIAGNOSIS — I358 Other nonrheumatic aortic valve disorders: Secondary | ICD-10-CM | POA: Diagnosis not present

## 2019-08-10 DIAGNOSIS — M1990 Unspecified osteoarthritis, unspecified site: Secondary | ICD-10-CM | POA: Diagnosis not present

## 2019-08-10 DIAGNOSIS — E039 Hypothyroidism, unspecified: Secondary | ICD-10-CM | POA: Diagnosis not present

## 2019-08-10 DIAGNOSIS — I10 Essential (primary) hypertension: Secondary | ICD-10-CM | POA: Diagnosis not present

## 2019-08-10 DIAGNOSIS — R0789 Other chest pain: Secondary | ICD-10-CM | POA: Diagnosis not present

## 2019-09-05 DIAGNOSIS — H5213 Myopia, bilateral: Secondary | ICD-10-CM | POA: Diagnosis not present

## 2019-09-05 DIAGNOSIS — H26493 Other secondary cataract, bilateral: Secondary | ICD-10-CM | POA: Diagnosis not present

## 2019-09-05 DIAGNOSIS — H401132 Primary open-angle glaucoma, bilateral, moderate stage: Secondary | ICD-10-CM | POA: Diagnosis not present

## 2019-09-05 DIAGNOSIS — H524 Presbyopia: Secondary | ICD-10-CM | POA: Diagnosis not present

## 2019-09-05 DIAGNOSIS — Z961 Presence of intraocular lens: Secondary | ICD-10-CM | POA: Diagnosis not present

## 2019-09-05 DIAGNOSIS — H353132 Nonexudative age-related macular degeneration, bilateral, intermediate dry stage: Secondary | ICD-10-CM | POA: Diagnosis not present

## 2019-09-05 DIAGNOSIS — H52223 Regular astigmatism, bilateral: Secondary | ICD-10-CM | POA: Diagnosis not present

## 2019-10-18 DIAGNOSIS — Z0001 Encounter for general adult medical examination with abnormal findings: Secondary | ICD-10-CM | POA: Diagnosis not present

## 2019-10-18 DIAGNOSIS — G4762 Sleep related leg cramps: Secondary | ICD-10-CM | POA: Diagnosis not present

## 2019-10-18 DIAGNOSIS — M1A00X0 Idiopathic chronic gout, unspecified site, without tophus (tophi): Secondary | ICD-10-CM | POA: Diagnosis not present

## 2019-10-18 DIAGNOSIS — H6123 Impacted cerumen, bilateral: Secondary | ICD-10-CM | POA: Diagnosis not present

## 2019-10-18 DIAGNOSIS — R351 Nocturia: Secondary | ICD-10-CM | POA: Diagnosis not present

## 2019-10-18 DIAGNOSIS — E039 Hypothyroidism, unspecified: Secondary | ICD-10-CM | POA: Diagnosis not present

## 2019-10-18 DIAGNOSIS — I10 Essential (primary) hypertension: Secondary | ICD-10-CM | POA: Diagnosis not present

## 2019-10-18 DIAGNOSIS — R5383 Other fatigue: Secondary | ICD-10-CM | POA: Diagnosis not present

## 2019-11-02 ENCOUNTER — Ambulatory Visit: Payer: Medicare Other | Attending: Internal Medicine

## 2019-11-02 DIAGNOSIS — Z23 Encounter for immunization: Secondary | ICD-10-CM | POA: Diagnosis not present

## 2019-11-02 NOTE — Progress Notes (Signed)
   Covid-19 Vaccination Clinic  Name:  [name]    MRN: [id number] DOB: 04/14/1936  11/02/2019  [name] was observed post Covid-19 immunization for 15 minutes without incidence. She was provided with Vaccine Information Sheet and instruction to access the V-Safe system.   [name] was instructed to call 911 with any severe reactions post vaccine: Marland Kitchen Difficulty breathing  . Swelling of your face and throat  . A fast heartbeat  . A bad rash all over your body  . Dizziness and weakness    Immunizations Administered    Name Date Dose VIS Date Route   Pfizer COVID-19 Vaccine 11/02/2019 12:51 PM 0.3 mL 09/23/2019 Intramuscular   Manufacturer: [place]   Lot: [place] [id number]   [place]: [id number]

## 2019-11-23 ENCOUNTER — Ambulatory Visit: Payer: Medicare Other | Attending: Internal Medicine

## 2019-11-23 DIAGNOSIS — Z23 Encounter for immunization: Secondary | ICD-10-CM | POA: Insufficient documentation

## 2019-11-23 NOTE — Progress Notes (Signed)
   [facility]ov[facility] Vaccination [facility]  Name:  [name]    MRN: [id number] DOB: 04-05-1936  11/23/2019  Ms. Foley was observed post Covid-19 immunization for 15 minutes without incidence. She was provided with Vaccine Information Sheet and instruction to access the V-Safe system.   [name] was instructed to call 911 with any severe reactions post vaccine: Marland Kitchen Difficulty breathing  . Swelling of your face and throat  . A fast heartbeat  . A bad rash all over your body  . Dizziness and weakness    Immunizations Administered    Name Date Dose VIS Date Route   Pfizer COVID-19 Vaccine 11/23/2019  8:14 AM 0.3 mL 09/23/2019 Intramuscular   Manufacturer: [place]   Lot: [id number]   Marissa: [id number]

## 2019-12-13 DIAGNOSIS — M1712 Unilateral primary osteoarthritis, left knee: Secondary | ICD-10-CM | POA: Diagnosis not present

## 2020-02-03 DIAGNOSIS — M5416 Radiculopathy, lumbar region: Secondary | ICD-10-CM | POA: Diagnosis not present

## 2020-02-03 DIAGNOSIS — M545 Low back pain: Secondary | ICD-10-CM | POA: Diagnosis not present

## 2020-02-03 DIAGNOSIS — R3989 Other symptoms and signs involving the genitourinary system: Secondary | ICD-10-CM | POA: Diagnosis not present

## 2020-02-11 DIAGNOSIS — M1712 Unilateral primary osteoarthritis, left knee: Secondary | ICD-10-CM | POA: Diagnosis not present

## 2020-03-05 DIAGNOSIS — H401132 Primary open-angle glaucoma, bilateral, moderate stage: Secondary | ICD-10-CM | POA: Diagnosis not present

## 2020-03-05 DIAGNOSIS — H353132 Nonexudative age-related macular degeneration, bilateral, intermediate dry stage: Secondary | ICD-10-CM | POA: Diagnosis not present

## 2020-03-05 DIAGNOSIS — H26493 Other secondary cataract, bilateral: Secondary | ICD-10-CM | POA: Diagnosis not present

## 2020-04-26 DIAGNOSIS — H903 Sensorineural hearing loss, bilateral: Secondary | ICD-10-CM | POA: Diagnosis not present

## 2020-04-26 DIAGNOSIS — H6123 Impacted cerumen, bilateral: Secondary | ICD-10-CM | POA: Diagnosis not present

## 2020-06-16 DIAGNOSIS — Z23 Encounter for immunization: Secondary | ICD-10-CM | POA: Diagnosis not present

## 2020-07-02 DIAGNOSIS — Z23 Encounter for immunization: Secondary | ICD-10-CM | POA: Diagnosis not present

## 2020-08-02 DIAGNOSIS — M79651 Pain in right thigh: Secondary | ICD-10-CM | POA: Diagnosis not present

## 2020-08-02 DIAGNOSIS — M79652 Pain in left thigh: Secondary | ICD-10-CM | POA: Diagnosis not present

## 2020-08-02 DIAGNOSIS — R600 Localized edema: Secondary | ICD-10-CM | POA: Diagnosis not present

## 2020-08-02 DIAGNOSIS — E039 Hypothyroidism, unspecified: Secondary | ICD-10-CM | POA: Diagnosis not present

## 2020-08-27 DIAGNOSIS — N179 Acute kidney failure, unspecified: Secondary | ICD-10-CM | POA: Diagnosis not present

## 2020-09-24 DIAGNOSIS — I10 Essential (primary) hypertension: Secondary | ICD-10-CM | POA: Diagnosis not present

## 2020-09-24 DIAGNOSIS — N1832 Chronic kidney disease, stage 3b: Secondary | ICD-10-CM | POA: Diagnosis not present

## 2020-09-24 DIAGNOSIS — R200 Anesthesia of skin: Secondary | ICD-10-CM | POA: Diagnosis not present

## 2020-09-24 DIAGNOSIS — R600 Localized edema: Secondary | ICD-10-CM | POA: Diagnosis not present

## 2020-09-24 DIAGNOSIS — M109 Gout, unspecified: Secondary | ICD-10-CM | POA: Diagnosis not present

## 2020-09-24 DIAGNOSIS — E559 Vitamin D deficiency, unspecified: Secondary | ICD-10-CM | POA: Diagnosis not present

## 2020-09-24 DIAGNOSIS — H900 Conductive hearing loss, bilateral: Secondary | ICD-10-CM | POA: Diagnosis not present

## 2020-09-24 DIAGNOSIS — M1A9XX0 Chronic gout, unspecified, without tophus (tophi): Secondary | ICD-10-CM | POA: Diagnosis not present

## 2020-09-24 DIAGNOSIS — S91321S Laceration with foreign body, right foot, sequela: Secondary | ICD-10-CM | POA: Diagnosis not present

## 2020-11-13 DIAGNOSIS — M109 Gout, unspecified: Secondary | ICD-10-CM | POA: Diagnosis not present

## 2020-11-13 DIAGNOSIS — E039 Hypothyroidism, unspecified: Secondary | ICD-10-CM | POA: Diagnosis not present

## 2020-11-13 DIAGNOSIS — Z6824 Body mass index (BMI) 24.0-24.9, adult: Secondary | ICD-10-CM | POA: Diagnosis not present

## 2020-11-13 DIAGNOSIS — I10 Essential (primary) hypertension: Secondary | ICD-10-CM | POA: Diagnosis not present

## 2020-11-13 DIAGNOSIS — I499 Cardiac arrhythmia, unspecified: Secondary | ICD-10-CM | POA: Diagnosis not present

## 2020-11-13 DIAGNOSIS — H9190 Unspecified hearing loss, unspecified ear: Secondary | ICD-10-CM | POA: Diagnosis not present

## 2020-11-13 DIAGNOSIS — G629 Polyneuropathy, unspecified: Secondary | ICD-10-CM | POA: Diagnosis not present

## 2020-11-13 DIAGNOSIS — H6123 Impacted cerumen, bilateral: Secondary | ICD-10-CM | POA: Diagnosis not present

## 2020-11-20 NOTE — Telephone Encounter (Signed)
Please call we're full until this summer and cannot keep a cancellation list. Thanks

## 2020-11-20 NOTE — Telephone Encounter (Signed)
-----   Message from [facility] sent at 11/20/2020  1:36 PM EST -----  Subject: Message to Provider    QUESTIONS  Information for Provider? New patient. Wants to be put on cancellation   list. Please call her if any cancelations for new PT.   ---------------------------------------------------------------------------  --------------  CALL BACK INFO  What is the best way for the office to contact you? OK to leave message on   voicemail  Preferred Call Back Phone Number? [phone]  ---------------------------------------------------------------------------  --------------  SCRIPT ANSWERS  Relationship to Patient? Self

## 2020-12-10 DIAGNOSIS — H66012 Acute suppurative otitis media with spontaneous rupture of ear drum, left ear: Secondary | ICD-10-CM | POA: Diagnosis not present

## 2020-12-10 DIAGNOSIS — H7292 Unspecified perforation of tympanic membrane, left ear: Secondary | ICD-10-CM | POA: Diagnosis not present

## 2020-12-18 DIAGNOSIS — H6123 Impacted cerumen, bilateral: Secondary | ICD-10-CM | POA: Diagnosis not present

## 2020-12-18 DIAGNOSIS — H60332 Swimmer's ear, left ear: Secondary | ICD-10-CM | POA: Diagnosis not present

## 2020-12-18 DIAGNOSIS — H903 Sensorineural hearing loss, bilateral: Secondary | ICD-10-CM | POA: Diagnosis not present

## 2020-12-27 ENCOUNTER — Ambulatory Visit (INDEPENDENT_AMBULATORY_CARE_PROVIDER_SITE_OTHER): Payer: Medicare Other | Admitting: Otolaryngology

## 2021-01-03 DIAGNOSIS — H6982 Other specified disorders of Eustachian tube, left ear: Secondary | ICD-10-CM | POA: Diagnosis not present

## 2021-01-03 DIAGNOSIS — H903 Sensorineural hearing loss, bilateral: Secondary | ICD-10-CM | POA: Diagnosis not present

## 2021-01-03 DIAGNOSIS — H838X3 Other specified diseases of inner ear, bilateral: Secondary | ICD-10-CM | POA: Diagnosis not present

## 2021-01-19 DIAGNOSIS — Z23 Encounter for immunization: Secondary | ICD-10-CM | POA: Diagnosis not present

## 2021-01-29 NOTE — Telephone Encounter (Signed)
Left message to call back if she would like to schedule as a new patient with Dr. Loney Hering.

## 2021-07-05 DIAGNOSIS — Z23 Encounter for immunization: Secondary | ICD-10-CM | POA: Diagnosis not present

## 2021-07-05 DIAGNOSIS — H903 Sensorineural hearing loss, bilateral: Secondary | ICD-10-CM | POA: Diagnosis not present

## 2021-07-05 DIAGNOSIS — H6123 Impacted cerumen, bilateral: Secondary | ICD-10-CM | POA: Diagnosis not present

## 2021-07-07 DIAGNOSIS — Z23 Encounter for immunization: Secondary | ICD-10-CM | POA: Diagnosis not present

## 2021-08-20 DIAGNOSIS — H26493 Other secondary cataract, bilateral: Secondary | ICD-10-CM | POA: Diagnosis not present

## 2021-08-20 DIAGNOSIS — H5213 Myopia, bilateral: Secondary | ICD-10-CM | POA: Diagnosis not present

## 2021-08-20 DIAGNOSIS — H401132 Primary open-angle glaucoma, bilateral, moderate stage: Secondary | ICD-10-CM | POA: Diagnosis not present

## 2021-08-20 DIAGNOSIS — H353132 Nonexudative age-related macular degeneration, bilateral, intermediate dry stage: Secondary | ICD-10-CM | POA: Diagnosis not present

## 2021-08-20 DIAGNOSIS — Z961 Presence of intraocular lens: Secondary | ICD-10-CM | POA: Diagnosis not present
